# Patient Record
Sex: Female | Born: 1970
Health system: Southern US, Community
[De-identification: ages and names within clinical notes are randomized; demographics above are authoritative.]

## PROBLEM LIST (undated history)

## (undated) DIAGNOSIS — F329 Major depressive disorder, single episode, unspecified: Secondary | ICD-10-CM

## (undated) DIAGNOSIS — IMO0002 Reserved for concepts with insufficient information to code with codable children: Secondary | ICD-10-CM

## (undated) DIAGNOSIS — E039 Hypothyroidism, unspecified: Secondary | ICD-10-CM

## (undated) DIAGNOSIS — D509 Iron deficiency anemia, unspecified: Secondary | ICD-10-CM

## (undated) DIAGNOSIS — E119 Type 2 diabetes mellitus without complications: Secondary | ICD-10-CM

## (undated) DIAGNOSIS — I10 Essential (primary) hypertension: Secondary | ICD-10-CM

## (undated) DIAGNOSIS — F32A Depression, unspecified: Secondary | ICD-10-CM

## (undated) DIAGNOSIS — E079 Disorder of thyroid, unspecified: Secondary | ICD-10-CM

## (undated) DIAGNOSIS — M5414 Radiculopathy, thoracic region: Secondary | ICD-10-CM

## (undated) DIAGNOSIS — J45909 Unspecified asthma, uncomplicated: Secondary | ICD-10-CM

## (undated) DIAGNOSIS — F319 Bipolar disorder, unspecified: Secondary | ICD-10-CM

## (undated) HISTORY — DX: Unspecified asthma, uncomplicated: J45.909

## (undated) HISTORY — PX: TUBAL LIGATION: SHX77

## (undated) HISTORY — DX: Depression, unspecified: F32.A

## (undated) HISTORY — DX: Iron deficiency anemia, unspecified: D50.9

## (undated) HISTORY — DX: Hypothyroidism, unspecified: E03.9

## (undated) HISTORY — DX: Disorder of thyroid, unspecified: E07.9

## (undated) HISTORY — DX: Radiculopathy, thoracic region: M54.14

## (undated) HISTORY — DX: Reserved for concepts with insufficient information to code with codable children: IMO0002

## (undated) HISTORY — DX: Essential (primary) hypertension: I10

## (undated) HISTORY — DX: Type 2 diabetes mellitus without complications: E11.9

## (undated) HISTORY — DX: Major depressive disorder, single episode, unspecified: F32.9

## (undated) HISTORY — PX: EYE SURGERY: SHX253

## (undated) HISTORY — DX: Bipolar disorder, unspecified: F31.9

---

## 2016-05-29 LAB — PSA

## 2016-05-29 LAB — HM PAP SMEAR

## 2016-06-11 LAB — PSA

## 2016-12-12 ENCOUNTER — Ambulatory Visit (INDEPENDENT_AMBULATORY_CARE_PROVIDER_SITE_OTHER): Payer: Self-pay | Admitting: Family Medicine

## 2016-12-12 ENCOUNTER — Encounter: Payer: Self-pay | Admitting: Family Medicine

## 2016-12-12 VITALS — BP 128/80 | HR 103 | Temp 98.1°F | Ht 63.0 in | Wt 159.4 lb

## 2016-12-12 DIAGNOSIS — IMO0001 Reserved for inherently not codable concepts without codable children: Secondary | ICD-10-CM

## 2016-12-12 DIAGNOSIS — K219 Gastro-esophageal reflux disease without esophagitis: Secondary | ICD-10-CM | POA: Diagnosis not present

## 2016-12-12 DIAGNOSIS — J4540 Moderate persistent asthma, uncomplicated: Secondary | ICD-10-CM

## 2016-12-12 DIAGNOSIS — Z13220 Encounter for screening for lipoid disorders: Secondary | ICD-10-CM

## 2016-12-12 DIAGNOSIS — G8929 Other chronic pain: Secondary | ICD-10-CM

## 2016-12-12 DIAGNOSIS — M545 Low back pain: Secondary | ICD-10-CM | POA: Diagnosis not present

## 2016-12-12 DIAGNOSIS — F319 Bipolar disorder, unspecified: Secondary | ICD-10-CM

## 2016-12-12 DIAGNOSIS — E1165 Type 2 diabetes mellitus with hyperglycemia: Secondary | ICD-10-CM | POA: Diagnosis not present

## 2016-12-12 DIAGNOSIS — Z23 Encounter for immunization: Secondary | ICD-10-CM | POA: Diagnosis not present

## 2016-12-12 DIAGNOSIS — E039 Hypothyroidism, unspecified: Secondary | ICD-10-CM

## 2016-12-12 MED ORDER — ESOMEPRAZOLE MAGNESIUM 40 MG PO PACK: 40.0000 mg | PACK | Freq: Every day | ORAL | 5 refills | Status: DC

## 2016-12-12 MED ORDER — TRAMADOL-ACETAMINOPHEN 37.5-325 MG PO TABS: 2.0000 | ORAL_TABLET | Freq: Every day | ORAL | 5 refills | Status: DC

## 2016-12-12 MED ORDER — PREGABALIN 75 MG PO CAPS: 75.0000 mg | ORAL_CAPSULE | Freq: Every day | ORAL | 5 refills | Status: DC

## 2016-12-12 MED ORDER — NAPROXEN 500 MG PO TABS: 500.0000 mg | ORAL_TABLET | Freq: Every day | ORAL | 5 refills | Status: DC

## 2016-12-12 MED FILL — ESOMEPRAZOLE MAG DR 40 MG C: 40 | 30 days supply | Qty: 30 | Fill #0 | Status: TO

## 2016-12-12 MED FILL — TRAMADOL-ACETAMINOPHN 37.5-: 37.5-325 | 30 days supply | Qty: 60 | Fill #0 | Status: TO

## 2016-12-12 MED FILL — NAPROXEN 500 MG TABLET: 500 | 30 days supply | Qty: 30 | Fill #0 | Status: TO

## 2016-12-12 NOTE — Patient Instructions (Addendum)
Healthy Eating Plan Many factors influence your heart health, including eating and exercise habits. Heart (coronary) risk increases with abnormal blood fat (lipid) levels. Heart-healthy meal planning includes limiting unhealthy fats, increasing healthy fats, and making other small dietary changes. This includes maintaining a healthy body weight to help keep lipid levels within a normal range.  WHAT IS MY PLAN?  Your health care provider recommends that you:  Drink a glass of water before meals to help with satiety.  Eat slowly.  An alternative to the water is to add Metamucil. This will help with satiety as well. It does contain calories, unlike water.  WHAT TYPES OF FAT SHOULD I CHOOSE?  Choose healthy fats more often. Choose monounsaturated and polyunsaturated fats, such as olive oil and canola oil, flaxseeds, walnuts, almonds, and seeds.  Eat more omega-3 fats. Good choices include salmon, mackerel, sardines, tuna, flaxseed oil, and ground flaxseeds. Aim to eat fish at least two times each week.  Avoid foods with partially hydrogenated oils in them. These contain trans fats. Examples of foods that contain trans fats are stick margarine, some tub margarines, cookies, crackers, and other baked goods. If you are going to avoid a fat, this is the one to avoid!  WHAT GENERAL GUIDELINES DO I NEED TO FOLLOW?  Check food labels carefully to identify foods with trans fats. Avoid these types of options when possible.  Fill one half of your plate with vegetables and green salads. Eat 4-5 servings of vegetables per day. A serving of vegetables equals 1 cup of raw leafy vegetables,  cup of raw or cooked cut-up vegetables, or  cup of vegetable juice.  Fill one fourth of your plate with whole grains. Look for the word "whole" as the first word in the ingredient list.  Fill one fourth of your plate with lean protein foods.  Eat 4-5 servings of fruit per day. A serving of fruit equals one medium  whole fruit,  cup of dried fruit,  cup of fresh, frozen, or canned fruit. Try to avoid fruits in cups/syrups as the sugar content can be high.  Eat more foods that contain soluble fiber. Examples of foods that contain this type of fiber are apples, broccoli, carrots, beans, peas, and barley. Aim to get 20-30 g of fiber per day.  Eat more home-cooked food and less restaurant, buffet, and fast food.  Limit or avoid alcohol.  Limit foods that are high in starch and sugar.  Avoid fried foods when able.  Cook foods by using methods other than frying. Baking, boiling, grilling, and broiling are all great options. Other fat-reducing suggestions include: ? Removing the skin from poultry. ? Removing all visible fats from meats. ? Skimming the fat off of stews, soups, and gravies before serving them. ? Steaming vegetables in water or broth.  Lose weight if you are overweight. Losing just 5-10% of your initial body weight can help your overall health and prevent diseases such as diabetes and heart disease.  Increase your consumption of nuts, legumes, and seeds to 4-5 servings per week. One serving of dried beans or legumes equals  cup after being cooked, one serving of nuts equals 1 ounces, and one serving of seeds equals  ounce or 1 tablespoon.  WHAT ARE GOOD FOODS CAN I EAT? Grains Grainy breads (try to find bread that is 3 g of fiber per slice or greater), oatmeal, light popcorn. Whole-grain cereals. Rice and pasta, including brown rice and those that are made with whole wheat.   Edamame pasta is a great alternative to grain pasta. It has a higher protein content. Try to avoid significant consumption of white bread, sugary cereals, or pastries/baked goods.  Vegetables All vegetables. Cooked white potatoes do not count as vegetables.  Fruits All fruits, but limit pineapple and bananas as these fruits have a higher sugar content.  Meats and Other Protein Sources Lean, well-trimmed beef,  veal, pork, and lamb. Chicken and [place] without skin. All fish and shellfish. Wild duck, rabbit, pheasant, and venison. Egg whites or low-cholesterol egg substitutes. Dried beans, peas, lentils, and tofu.Seeds and most nuts.  Dairy Low-fat or nonfat cheeses, including ricotta, string, and mozzarella. Skim or 1% milk that is liquid, powdered, or evaporated. Buttermilk that is made with low-fat milk. Nonfat or low-fat yogurt. Soy/Almond milk are good alternatives if you cannot handle dairy.  Beverages Water is the best for you. Sports drinks with less sugar are more desirable unless you are a highly active athlete.  Sweets and Desserts Sherbets and fruit ices. Honey, jam, marmalade, jelly, and syrups. Dark chocolate.  Eat all sweets and desserts in moderation.  Fats and Oils Nonhydrogenated (trans-free) margarines. Vegetable oils, including soybean, sesame, sunflower, olive, peanut, safflower, corn, canola, and cottonseed. Salad dressings or mayonnaise that are made with a vegetable oil. Limit added fats and oils that you use for cooking, baking, salads, and as spreads.  Other Cocoa powder. Coffee and tea. Most condiments.  The items listed above may not be a complete list of recommended foods or beverages. Contact your dietitian for more options.  [place]

## 2016-12-12 NOTE — Progress Notes (Signed)
Pre visit review using our clinic review tool, if applicable. No additional management support is needed unless otherwise documented below in the visit note. 

## 2016-12-12 NOTE — Progress Notes (Signed)
Chief Complaint  Patient presents with  . Establish Care    pt want to discuss her DM       New Patient Visit SUBJECTIVE: HPI: Stephanie Travis is an 46 y.o.female who is being seen for establishing care.  The patient was previously seen at an office in [place].  DM Pt does not currently have a glucometer She does not require insulin. She is currently taking a sulfonylurea and Metformin. She is not on a statin is on an ACEi/ARB. PCV23? Never Flu shot? Will get today  Eye exam? 05/2016  LBP Hx of chronic low back pain, currently on meds, Never had PT for this, no imaging.  Does have some numbness and tingling in LLE, unsure if it is due to DM. No weakness, loss of bowel/bladder function.  She does have a history of bipolar disorder, ADHD, asthma, and hypothyroidism. She is stable on her current regimen for each of these, however was following psychiatry.  No Known Allergies  Past Medical History:  Diagnosis Date  . Asthma   . Bipolar 1 disorder ([place]) 12/13/2016  . Depression   . Diabetes mellitus without complication ([place])   . Hypertension   . Hypothyroid 12/13/2016  . Iron (Fe) deficiency anemia   . Pinched thoracic nerve root   . Thyroid disease   . Type 2 diabetes mellitus ([place]) 12/13/2016  . Ulcer [facility])    Past Surgical History:  Procedure Laterality Date  . CESAREAN SECTION     x 3  . EYE SURGERY     Laser x 2  . TUBAL LIGATION     Social History   Social History  . Marital status: Married   Social History Main Topics  . Smoking status: Former Smoker    Quit date: 12/12/2013  . Smokeless tobacco: Never Used  . Alcohol use Yes     Comment: Former alcoholic  . Drug use: No   Family History  Problem Relation Age of Onset  . Arthritis Mother   . Ulcers Mother   . Depression Father   . Diabetes Father   . Arthritis Father   . Hypertension Father   . Ulcers Sister   . ADD / ADHD Daughter   . Anxiety disorder Daughter   . Stroke Maternal  Grandfather   . Alzheimer's disease Paternal Grandmother   . Depression Paternal Grandfather   . Bipolar disorder Paternal Grandfather     Current Outpatient Prescriptions:  .  ARIPiprazole (ABILIFY) 5 MG tablet, Take 5 mg by mouth at bedtime., Disp: , Rfl:  .  metFORMIN (GLUCOPHAGE) 1000 MG tablet, Take 1,000 mg by mouth 2 (two) times daily with a meal., Disp: , Rfl:  .  methylphenidate (RITALIN LA) 40 MG 24 hr capsule, Take 40 mg by mouth every morning., Disp: , Rfl:  .  pregabalin (LYRICA) 75 MG capsule, Take 1 capsule (75 mg total) by mouth at bedtime., Disp: 30 capsule, Rfl: 5 .  thiamine 100 MG tablet, Take 100 mg by mouth daily., Disp: , Rfl:  .  traMADol-acetaminophen (ULTRACET) 37.5-325 MG tablet, Take 2 tablets by mouth at bedtime., Disp: 60 tablet, Rfl: 5 .  Fluticasone-Salmeterol (ADVAIR DISKUS) 250-50 MCG/DOSE AEPB, Inhale 1 puff into the lungs 2 (two) times daily., Disp: 1 each, Rfl: 3 .  glyBURIDE (DIABETA) 5 MG tablet, Take 1 tablet (5 mg total) by mouth daily with breakfast., Disp: , Rfl:  .  levothyroxine (SYNTHROID, LEVOTHROID) 100 MCG tablet, Take 1 tablet (100 mcg total)  by mouth daily., Disp: 90 tablet, Rfl: 3 .  lisinopril (PRINIVIL,ZESTRIL) 20 MG tablet, Take 1 tablet (20 mg total) by mouth daily., Disp: 90 tablet, Rfl: 1 .  lithium 300 MG tablet, Take 1 tablet (300 mg total) by mouth at bedtime., Disp: , Rfl:  .  naproxen (NAPROSYN) 500 MG tablet, Take 1 tablet (500 mg total) by mouth at bedtime., Disp: 30 tablet, Rfl: 5  Patient's last menstrual period was 11/21/2016.  ROS Cardiovascular: Denies chest pain  Respiratory: Denies dyspnea   OBJECTIVE: BP 128/80 (BP Location: Left Arm, Patient Position: Sitting, Cuff Size: Normal)   Pulse (!) 103   Temp 98.1 F (36.7 C) (Oral)   Ht 5\' 3"  (1.6 m)   Wt 159 lb 6.4 oz (72.3 kg)   LMP 11/21/2016   SpO2 98%   BMI 28.24 kg/m   Constitutional: -  VS reviewed -  Well developed, well nourished, appears stated age -   No apparent distress  Psychiatric: -  Oriented to person, place, and time -  Memory intact -  Affect and mood normal -  Fluent conversation, good eye contact -  Judgment and insight age appropriate  Eye: -  Conjunctivae clear, no discharge -  Pupils symmetric, round, reactive to light  ENMT: -  Oral mucosa without lesions, tongue and uvula midline    Tonsils not enlarged, no erythema, no exudate, trachea midline    Pharynx moist, no lesions, no erythema  Neck: -  No gross swelling, no palpable masses -  Thyroid midline, not enlarged, mobile, no palpable masses  Cardiovascular: -  RRR, no murmurs -  No LE edema  Respiratory: -  Normal respiratory effort, no accessory muscle use, no retraction -  Breath sounds equal, no wheezes, no ronchi, no crackles  Neurological:  -  CN II - XII grossly intact -  4/4 patellar reflex b/l, no clonus -  Sensation grossly intact to light touch, equal bilaterally  Musculoskeletal: -  No clubbing, no cyanosis -  Gait normal -  TTP in lumbar paraspinals -  Neg straight leg and Lesegue's -  5/5 strength throughout in LE's  Skin: -  No significant lesion on inspection -  Warm and dry to palpation   ASSESSMENT/PLAN: Chronic bilateral low back pain without sciatica - Plan: naproxen (NAPROSYN) 500 MG tablet, traMADol-acetaminophen (ULTRACET) 37.5-325 MG tablet, pregabalin (LYRICA) 75 MG capsule, MR Lumbar Spine Wo Contrast  Gastroesophageal reflux disease, esophagitis presence not specified - Plan: DISCONTINUED: esomeprazole (NEXIUM) 40 MG packet  Screening cholesterol level - Plan: Lipid panel  Uncontrolled type 2 diabetes mellitus without complication, without long-term current use of insulin (HCC) - Plan: Hemoglobin A1c, Comprehensive metabolic panel, Microalbumin / creatinine urine ratio, glyBURIDE (DIABETA) 5 MG tablet  Need for influenza vaccination - Plan: Flu Vaccine QUAD 36+ mos PF IM (Fluarix & Fluzone Quad PF)  Need for pneumococcal  vaccination - Plan: Pneumococcal polysaccharide vaccine 23-valent greater than or equal to 2yo subcutaneous/IM  Moderate persistent asthma without complication - Plan: Fluticasone-Salmeterol (ADVAIR DISKUS) 250-50 MCG/DOSE AEPB  Bipolar 1 disorder (HCC) - Plan: lithium 300 MG tablet  Hypothyroidism, unspecified type - Plan: levothyroxine (SYNTHROID, LEVOTHROID) 100 MCG tablet  Patient instructed to sign release of records form from her previous PCP. Orders as above. Need to give list of psych resources so she can get set up. I will refill meds until she is set.  I need to change her meds from generics in [place] to one's offered in [name].  Check sugars 3-4 times per week. Counseled on diet and exercise. Offered PT, but due to transportation issues, pt would like to start with imaging. Stated that insurance may want to have the PT first, but we will try. Patient should return in 2 weeks to review labs and have a dedicated DM visit. The patient voiced understanding and agreement to the plan.  Greater than 45 minutes were spent face to face with the patient with greater than 50% of this time spent counseling with DM and coordinating her medications. Her S African versions needed to be translated to [name] versions.   [place], DO 12/13/16  10:03 AM

## 2016-12-13 ENCOUNTER — Encounter: Payer: Self-pay | Admitting: Family Medicine

## 2016-12-13 DIAGNOSIS — J45909 Unspecified asthma, uncomplicated: Secondary | ICD-10-CM | POA: Insufficient documentation

## 2016-12-13 DIAGNOSIS — F341 Dysthymic disorder: Secondary | ICD-10-CM | POA: Insufficient documentation

## 2016-12-13 DIAGNOSIS — F319 Bipolar disorder, unspecified: Secondary | ICD-10-CM

## 2016-12-13 DIAGNOSIS — E119 Type 2 diabetes mellitus without complications: Secondary | ICD-10-CM

## 2016-12-13 DIAGNOSIS — E039 Hypothyroidism, unspecified: Secondary | ICD-10-CM

## 2016-12-13 HISTORY — DX: Hypothyroidism, unspecified: E03.9

## 2016-12-13 HISTORY — DX: Bipolar disorder, unspecified: F31.9

## 2016-12-13 HISTORY — DX: Type 2 diabetes mellitus without complications: E11.9

## 2016-12-15 ENCOUNTER — Ambulatory Visit (HOSPITAL_BASED_OUTPATIENT_CLINIC_OR_DEPARTMENT_OTHER): Payer: Self-pay

## 2016-12-18 ENCOUNTER — Other Ambulatory Visit: Payer: Self-pay

## 2016-12-18 ENCOUNTER — Ambulatory Visit (HOSPITAL_COMMUNITY)
Admission: RE | Admit: 2016-12-18 | Discharge: 2016-12-18 | Disposition: A | Discharge status: Home / Self Care | Payer: BLUE CROSS/BLUE SHIELD | Attending: Psychiatry | Admitting: Psychiatry

## 2016-12-18 ENCOUNTER — Telehealth: Payer: Self-pay | Admitting: Family Medicine

## 2016-12-18 LAB — MICROALBUMIN / CREATININE URINE RATIO
CREATININE, U: 187 mg/dL
MICROALB UR: 8.7 mg/dL — AB (ref 0.0–1.9)
Microalb Creat Ratio: 4.7 mg/g (ref 0.0–30.0)

## 2016-12-18 LAB — COMPREHENSIVE METABOLIC PANEL
ALK PHOS: 79 U/L (ref 39–117)
ALT: 42 U/L — ABNORMAL HIGH (ref 0–35)
AST: 23 U/L (ref 0–37)
Albumin: 4.3 g/dL (ref 3.5–5.2)
BUN: 14 mg/dL (ref 6–23)
CHLORIDE: 97 meq/L (ref 96–112)
CO2: 29 meq/L (ref 19–32)
Calcium: 9.5 mg/dL (ref 8.4–10.5)
Creatinine, Ser: 0.75 mg/dL (ref 0.40–1.20)
GFR: 89.38 mL/min (ref >60.00)
GLUCOSE: 206 mg/dL — AB (ref 70–99)
POTASSIUM: 4 meq/L (ref 3.5–5.1)
SODIUM: 135 meq/L (ref 135–145)
TOTAL PROTEIN: 7.2 g/dL (ref 6.0–8.3)
Total Bilirubin: 0.3 mg/dL (ref 0.2–1.2)

## 2016-12-18 LAB — LIPID PANEL
CHOL/HDL RATIO: 3
Cholesterol: 184 mg/dL (ref 0–200)
HDL: 52.7 mg/dL (ref >39.00)
NONHDL: 131.08
Triglycerides: 270 mg/dL — ABNORMAL HIGH (ref 0.0–149.0)
VLDL: 54 mg/dL — ABNORMAL HIGH (ref 0.0–40.0)

## 2016-12-18 LAB — LDL CHOLESTEROL, DIRECT: Direct LDL: 96 mg/dL

## 2016-12-18 LAB — HEMOGLOBIN A1C: Hgb A1c MFr Bld: 10.5 % — ABNORMAL HIGH (ref 4.6–6.5)

## 2016-12-18 NOTE — H&P (Signed)
Behavioral Health Medical Screening Exam  Stephanie Travis is an 46 y.o. female.  Total Time spent with patient: 15 minutes  Psychiatric Specialty Exam: Physical Exam  Constitutional: She is oriented to person, place, and time. She appears well-developed and well-nourished.  HENT:  Head: Normocephalic and atraumatic.  Eyes: Conjunctivae are normal. Pupils are equal, round, and reactive to light.  Neck: Normal range of motion.  Cardiovascular: Normal rate, regular rhythm and normal heart sounds.   Respiratory: Effort normal and breath sounds normal.  GI: Soft. Bowel sounds are normal.  Musculoskeletal: Normal range of motion.  Neurological: She is alert and oriented to person, place, and time.  Skin: Skin is warm and dry.    Review of Systems  Psychiatric/Behavioral: Positive for depression. Negative for hallucinations, substance abuse and suicidal ideas. The patient is nervous/anxious and has insomnia.   All other systems reviewed and are negative.   Blood pressure 123/67, pulse (!) 103, temperature 99 F (37.2 C), resp. rate 18, last menstrual period 11/21/2016, SpO2 99 %.There is no height or weight on file to calculate BMI.  General Appearance: Casual and Fairly Groomed  Eye Contact:  Good  Speech:  Clear and Coherent and Normal Rate  Volume:  Normal  Mood:  Anxious  Affect:  Appropriate and Congruent  Thought Process:  Coherent, Goal Directed, Linear and Descriptions of Associations: Intact  Orientation:  Full (Time, Place, and Person)  Thought Content:  Focused on medications  Suicidal Thoughts:  No  Homicidal Thoughts:  No  Memory:  Immediate;   Fair Recent;   Fair Remote;   Fair  Judgement:  Fair  Insight:  Fair  Psychomotor Activity:  Normal  Concentration: Concentration: Fair and Attention Span: Fair  Recall:  [employer] of Knowledge:Fair  Language: Fair  Akathisia:  No  Handed:    AIMS (if indicated):     Assets:  Communication Skills Desire for  Improvement Social Support Talents/Skills  Sleep:       Musculoskeletal: Strength & Muscle Tone: within normal limits Gait & Station: normal Patient leans: N/A  Blood pressure 123/67, pulse (!) 103, temperature 99 F (37.2 C), resp. rate 18, last menstrual period 11/21/2016, SpO2 99 %.  Recommendations:  Based on my evaluation the patient does not appear to have an emergency medical condition.  Stephanie Mola, FNP 12/18/2016, 6:25 PM

## 2016-12-18 NOTE — Telephone Encounter (Signed)
Caller name: Azia Relation to pt: self  Call back number: [phone] Pharmacy:  Reason for call: Pt came in office stating that she went to the pharmacy to pick up her meds, pt states during her visit on the 12-12-2016 with PCP, pt mentioned that she needed all her meds to be sent to pharmacy since she had a prescription from [place] but that prescription does not work here and that she is needing all her meds sent to the pharmacy with the exception of LYRICA, NAPROXEN and TRAMADOL, since these meds were already sent to pharmacy on the 12-12-2016. Please advise.

## 2016-12-18 NOTE — BH Assessment (Addendum)
Walk In Assessment Note  Stephanie Travis is an 46 y.o. female that requests outpatient resources for medication management.  Patient denies SI/HI/Psychosis/Substance Abuse.   Patient is a walk in that was accompanied by Stephanie Stephanie Travis.  Patient reports that she is from [place] and she moved to [place] with Stephanie Travis and three children ages (47,5,62) years old.  Patient reports that Stephanie Travis's job relocated the family to [facility].   Patient reports that she was receiving services for Stephanie psychiatric medication management and she needs to have Stephanie psychiatric medicatio    Patient reports a prior psychiatric inpatient hospitalization in 2015 due to a suicide attempt.  Patient reports a past history of alcohol abuse.  Patient reports that she has been clean for the past five years.      Diagnosis: Bipolar Disorder   Past Medical History:  Past Medical History:  Diagnosis Date  . Asthma   . Bipolar 1 disorder ([place]) 12/13/2016  . Depression   . Diabetes mellitus without complication ([place])   . Hypertension   . Hypothyroid 12/13/2016  . Iron (Fe) deficiency anemia   . Pinched thoracic nerve root   . Thyroid disease   . Type 2 diabetes mellitus ([place]) 12/13/2016  . Ulcer [facility])     Past Surgical History:  Procedure Laterality Date  . CESAREAN SECTION     x 3  . EYE SURGERY     Laser x 2  . TUBAL LIGATION      Family History:  Family History  Problem Relation Age of Onset  . Arthritis Stephanie   . Ulcers Stephanie   . Depression Father   . Diabetes Father   . Arthritis Father   . Hypertension Father   . Ulcers Sister   . ADD / ADHD Daughter   . Anxiety disorder Daughter   . Stroke Maternal Grandfather   . Alzheimer's disease Paternal Grandmother   . Depression Paternal Grandfather   . Bipolar disorder Paternal Grandfather     Social History:  reports that she quit smoking about 3 years ago. She has never used smokeless tobacco. She reports that she drinks alcohol. She reports that  she does not use drugs.  Additional Social History:  Alcohol / Drug Use History of alcohol / drug use?: Yes Longest period of sobriety (when/how long): 5 years Negative Consequences of Use:  (None Reported) Withdrawal Symptoms:  (None Reported) Substance #1 Name of Substance 1: Alcohol 1 - Age of First Use: 20 1 - Amount (size/oz): [name] has been clean for five years. 1 - Frequency: [name] has been clean for five years. 1 - Duration: [name] has been clean for five years. 1 - Last Use / Amount: [name] has been clean for five years.  CIWA:   COWS:    Allergies: No Known Allergies  Home Medications:  (Not in a hospital admission)  OB/GYN Status:  Patient's last menstrual period was 11/21/2016.  General Assessment Data Location of Assessment: [facility] Assessment Services TTS Assessment: In system Is this a Tele or Face-to-Face Assessment?: Face-to-Face Is this an Initial Assessment or a Re-assessment for this encounter?: Initial Assessment Marital status: Married (Married since 2004) [place] name: NA Is patient pregnant?: No Pregnancy Status: No Living Arrangements: Spouse/significant other, Children (Patient lives with Stephanie Travis and three children (11,9,7)yo) Can pt return to current living arrangement?: Yes Admission Status: Voluntary Is patient capable of signing voluntary admission?: Yes Referral Source: Self/Family/Friend Insurance type: Self-Pay  Medical Screening Exam [facility]  Walk-in ONLY) Medical Exam completed: Yes Heloise Purpura, NP - completed MSE Exam..)  Crisis Care Plan Living Arrangements: Spouse/significant other, Children (Patient lives with Stephanie Travis and three children (11,9,7)yo) Legal Guardian:  (NA) Name of Psychiatrist: None Reported (Previous psychiatrist in [place]) Name of Therapist: None Reported  Education Status Is patient currently in school?: No Current Grade: NA Highest grade of school patient has completed: NA Name of school:  NA Contact person: None Reported  Risk to self with the past 6 months Suicidal Ideation: No Has patient been a risk to self within the past 6 months prior to admission? : No Suicidal Intent: No Has patient had any suicidal intent within the past 6 months prior to admission? : No Is patient at risk for suicide?: No Suicidal Plan?: No Has patient had any suicidal plan within the past 6 months prior to admission? : No Access to Means: No What has been your use of drugs/alcohol within the last 12 months?: na Previous Attempts/Gestures: Yes How many times?: 1 Other Self Harm Risks: None Reported Triggers for Past Attempts: Other (Comment) (Abusing alcohol) Intentional Self Injurious Behavior: None Family Suicide History: Unknown Recent stressful life event(s): Other (Comment) (Moved from [place]  to [place] 1 week ago) Persecutory voices/beliefs?: No Depression: Yes Depression Symptoms: Despondent, Fatigue, Guilt Substance abuse history and/or treatment for substance abuse?: Yes (Clean for 5 years) Suicide prevention information given to non-admitted patients: Yes  Risk to Others within the past 6 months Homicidal Ideation: No Does patient have any lifetime risk of violence toward others beyond the six months prior to admission? : No Thoughts of Harm to Others: No Current Homicidal Intent: No Current Homicidal Plan: No Access to Homicidal Means: No Identified Victim: None Reported History of harm to others?: No Assessment of Violence: None Noted Violent Behavior Description: None Reported Does patient have access to weapons?: No Criminal Charges Pending?: No Does patient have a court date: No Is patient on probation?: No  Psychosis Hallucinations: None noted Delusions: None noted  Mental Status Report Appearance/Hygiene: Disheveled Eye Contact: Fair Motor Activity: Freedom of movement Speech: Logical/coherent Level of Consciousness: Alert Mood: Depressed Affect:  Appropriate to circumstance Anxiety Level: Minimal Thought Processes: Coherent, Relevant Judgement: Unimpaired Orientation: Person, Place, Time, Situation Obsessive Compulsive Thoughts/Behaviors: None  Cognitive Functioning Concentration: Decreased Memory: Recent Intact, Remote Intact IQ: Average Insight: Fair Impulse Control: Good Appetite: Fair Weight Loss: 0 Weight Gain: 0 Sleep: No Change Total Hours of Sleep: 8 Vegetative Symptoms: None  ADLScreening [facility] Assessment Services) Patient's cognitive ability adequate to safely complete daily activities?: Yes Patient able to express need for assistance with ADLs?: No Independently performs ADLs?: Yes (appropriate for developmental age)  Prior Inpatient Therapy Prior Inpatient Therapy: Yes Prior Therapy Dates: 2015 Prior Therapy Facilty/Provider(s): [facility]  Reason for Treatment: SI and SA Treatment   Prior Outpatient Therapy Prior Outpatient Therapy: Yes Prior Therapy Dates: 2018 Prior Therapy Facilty/Provider(s): [facility] Reason for Treatment: Medication Management  Does patient have an ACCT team?: No Does patient have Intensive In-House Services?  : No Does patient have [facility] services? : No Does patient have P4CC services?: No  ADL Screening (condition at time of admission) Patient's cognitive ability adequate to safely complete daily activities?: Yes Is the patient deaf or have difficulty hearing?: No Does the patient have difficulty seeing, even when wearing glasses/contacts?: No Does the patient have difficulty concentrating, remembering, or making decisions?: No Patient able to express need for assistance with ADLs?: No  Does the patient have difficulty dressing or bathing?: No Independently performs ADLs?: Yes (appropriate for developmental age) Does the patient have difficulty walking or climbing stairs?: No Weakness of Legs: None Weakness of Arms/Hands: None  Home  Assistive Devices/Equipment Home Assistive Devices/Equipment: None    Abuse/Neglect Assessment (Assessment to be complete while patient is alone) Physical Abuse: Denies Verbal Abuse: Denies Sexual Abuse: Denies Exploitation of patient/patient's resources: Denies Self-Neglect: Denies Values / Beliefs Cultural Requests During Hospitalization: None Spiritual Requests During Hospitalization: None Consults Spiritual Care Consult Needed: No Social Work Consult Needed: No Regulatory affairs officer (For Healthcare) Does Patient Have a Medical Advance Directive?: No Would patient like information on creating a medical advance directive?: No - Patient declined    Additional Information 1:1 In Past 12 Months?: No CIRT Risk: No Elopement Risk: No Does patient have medical clearance?: Yes     Disposition: Per Catalina Pizza, DNP.  Patient does not meet criteria for inpatient hospitalization.  Writer provided patient with outpatient resources for [facility] as well as [facility] open access.    Disposition Initial Assessment Completed for this Encounter: Yes Disposition of Patient: Outpatient treatment Type of outpatient treatment: Adult  On Site Evaluation by:   Reviewed with Physician:    Graciella Freer [place] 12/18/2016 12:27 PM

## 2016-12-19 NOTE — Telephone Encounter (Signed)
Attempted to contact patient unable to leave Vm. Pt Rx was sent to the pharmacy on 2/14 and 2/15. TL/CMA

## 2016-12-20 ENCOUNTER — Other Ambulatory Visit: Payer: Self-pay | Admitting: Family Medicine

## 2016-12-20 DIAGNOSIS — F319 Bipolar disorder, unspecified: Secondary | ICD-10-CM

## 2016-12-20 DIAGNOSIS — E039 Hypothyroidism, unspecified: Secondary | ICD-10-CM

## 2016-12-20 DIAGNOSIS — J4540 Moderate persistent asthma, uncomplicated: Secondary | ICD-10-CM

## 2016-12-20 MED ORDER — LITHIUM CARBONATE 300 MG PO TABS: 300.0000 mg | ORAL_TABLET | Freq: Every day | ORAL | 1 refills | Status: DC

## 2016-12-20 MED ORDER — LEVOTHYROXINE SODIUM 100 MCG PO TABS: 100.0000 ug | ORAL_TABLET | Freq: Every day | ORAL | 1 refills | Status: DC

## 2016-12-20 MED ORDER — METFORMIN HCL 1000 MG PO TABS: 1000.0000 mg | ORAL_TABLET | Freq: Two times a day (BID) | ORAL | 1 refills | Status: DC

## 2016-12-20 MED ORDER — FLUTICASONE-SALMETEROL 250-50 MCG/DOSE IN AEPB: 1.0000 | INHALATION_SPRAY | Freq: Two times a day (BID) | RESPIRATORY_TRACT | 3 refills | Status: DC

## 2016-12-20 MED FILL — metFORMIN HCL 1000 MG TABS: 1000 | 45 days supply | Qty: 90 | Fill #0 | Status: TO

## 2016-12-20 MED FILL — LEVOTHYROXINE 100 MCG TAB: 100 | 90 days supply | Qty: 90 | Fill #0 | Status: TO

## 2016-12-20 MED FILL — LITHIUM CARBONATE 300 MG TA: 300 | 90 days supply | Qty: 90 | Fill #0 | Status: TO

## 2016-12-20 NOTE — Telephone Encounter (Signed)
I have spoken to patient and alerted her Rx was sent to pharmacy. TL/CMA

## 2016-12-25 MED FILL — LYRICA 75 MG CAPSULE: 75 | 30 days supply | Qty: 30 | Fill #0 | Status: TO

## 2016-12-25 MED FILL — ADVAIR 250/50 DISKUS: 250-50 | 30 days supply | Qty: 60 | Fill #0 | Status: TO

## 2017-01-03 ENCOUNTER — Encounter: Payer: Self-pay | Admitting: Family Medicine

## 2017-01-03 ENCOUNTER — Ambulatory Visit (INDEPENDENT_AMBULATORY_CARE_PROVIDER_SITE_OTHER): Payer: BLUE CROSS/BLUE SHIELD | Admitting: Family Medicine

## 2017-01-03 VITALS — BP 116/60 | HR 89 | Temp 97.6°F | Ht 63.0 in | Wt 162.4 lb

## 2017-01-03 DIAGNOSIS — F909 Attention-deficit hyperactivity disorder, unspecified type: Secondary | ICD-10-CM | POA: Diagnosis not present

## 2017-01-03 DIAGNOSIS — Z23 Encounter for immunization: Secondary | ICD-10-CM | POA: Diagnosis not present

## 2017-01-03 DIAGNOSIS — F319 Bipolar disorder, unspecified: Secondary | ICD-10-CM | POA: Diagnosis not present

## 2017-01-03 DIAGNOSIS — E119 Type 2 diabetes mellitus without complications: Secondary | ICD-10-CM

## 2017-01-03 MED ORDER — ATORVASTATIN CALCIUM 40 MG PO TABS: 40.0000 mg | ORAL_TABLET | Freq: Every day | ORAL | 3 refills | Status: DC

## 2017-01-03 MED ORDER — METHYLPHENIDATE HCL ER (LA) 40 MG PO CP24: 40.0000 mg | ORAL_CAPSULE | ORAL | 0 refills | Status: DC

## 2017-01-03 MED ORDER — GLYBURIDE 5 MG PO TABS: 10.0000 mg | ORAL_TABLET | Freq: Every day | ORAL | 5 refills | Status: DC

## 2017-01-03 MED ORDER — GLUCOSE BLOOD VI STRP: ORAL_STRIP | 12 refills | Status: DC

## 2017-01-03 MED ORDER — DESVENLAFAXINE ER 100 MG PO TB24: 100.0000 mg | ORAL_TABLET | Freq: Every day | ORAL | 5 refills | Status: DC

## 2017-01-03 MED ORDER — LISINOPRIL 20 MG PO TABS: 20.0000 mg | ORAL_TABLET | Freq: Every day | ORAL | 1 refills | Status: DC

## 2017-01-03 NOTE — Progress Notes (Signed)
Subjective:   Chief Complaint  Patient presents with  . Follow-up    to discuss recent lab results    Stephanie Travis is a 46 y.o. female here for follow-up of diabetes.   Stephanie Travis's self monitored glucose range is high 100's to low 200's. Patient denies hypoglycemic reactions. She checks her glucose levels 1 time per day. Patient does not require insulin.   Medications include: Metformin 1000 mg BID, Glyburide 5 mg daily Patient exercises 0 days per week on average.   Statin? No ACEi/ARB? Yes  Past Medical History:  Diagnosis Date  . Asthma   . Bipolar 1 disorder ([place]) 12/13/2016  . Depression   . Diabetes mellitus without complication ([place])   . Hypertension   . Hypothyroid 12/13/2016  . Iron (Fe) deficiency anemia   . Pinched thoracic nerve root   . Thyroid disease   . Type 2 diabetes mellitus ([place]) 12/13/2016  . Ulcer [facility])     Past Surgical History:  Procedure Laterality Date  . CESAREAN SECTION     x 3  . EYE SURGERY     Laser x 2  . TUBAL LIGATION      Social History   Social History  . Marital status: Married   Social History Main Topics  . Smoking status: Former Smoker    Quit date: 12/12/2013  . Smokeless tobacco: Never Used  . Alcohol use Yes     Comment: Former alcoholic  . Drug use: No   Current Outpatient Prescriptions on File Prior to Visit  Medication Sig Dispense Refill  . ARIPiprazole (ABILIFY) 5 MG tablet Take 5 mg by mouth at bedtime.    . Fluticasone-Salmeterol (ADVAIR DISKUS) 250-50 MCG/DOSE AEPB Inhale 1 puff into the lungs 2 (two) times daily. 1 each 3  . glyBURIDE (DIABETA) 5 MG tablet Take 1 tablet (5 mg total) by mouth daily with breakfast.    . levothyroxine (SYNTHROID, LEVOTHROID) 100 MCG tablet Take 1 tablet (100 mcg total) by mouth daily. 90 tablet 1  . lisinopril (PRINIVIL,ZESTRIL) 20 MG tablet Take 1 tablet (20 mg total) by mouth daily. 90 tablet 1  . lithium 300 MG tablet Take 1 tablet (300 mg total) by mouth at bedtime. 90 tablet 1   . metFORMIN (GLUCOPHAGE) 1000 MG tablet Take 1 tablet (1,000 mg total) by mouth 2 (two) times daily with a meal. 90 tablet 1  . methylphenidate (RITALIN LA) 40 MG 24 hr capsule Take 40 mg by mouth every morning.    . naproxen (NAPROSYN) 500 MG tablet Take 1 tablet (500 mg total) by mouth at bedtime. 30 tablet 5  . pregabalin (LYRICA) 75 MG capsule Take 1 capsule (75 mg total) by mouth at bedtime. 30 capsule 5  . thiamine 100 MG tablet Take 100 mg by mouth daily.    . traMADol-acetaminophen (ULTRACET) 37.5-325 MG tablet Take 2 tablets by mouth at bedtime. 60 tablet 5   Related testing: Foot exam(monofilament and inspection):done Retinal exam:done Date of retinal exam: 05/2016  Done by:  Dr in [place] Pneumovax: 12/12/16 Flu Shot: 12/12/16  Review of Systems: Eye:  No recent significant change in vision Pulmonary:  No SOB Cardiovascular:  No chest pain, no palpitations Skin/Integumentary ROS:  No abnormal skin lesions reported Neurologic:  No numbness, tingling  Objective:  BP 116/60 (BP Location: Left Arm, Patient Position: Sitting, Cuff Size: Normal)   Pulse 89   Temp 97.6 F (36.4 C) (Oral)   Ht 5\' 3"  (1.6 m)  Wt 162 lb 6.4 oz (73.7 kg)   LMP 12/26/2016   SpO2 98%   BMI 28.77 kg/m  General:  Well developed, well nourished, in no apparent distress Skin:  Warm, no pallor or diaphoresis Head:  Normocephalic, atraumatic Eyes:  Pupils equal and round, sclera anicteric without injection  Nose:  External nares without trauma, no discharge Throat/Pharynx:  Lips and gingiva without lesion Neck: Neck supple.  No obvious thyromegaly or masses.  No bruits Lungs:  clear to auscultation, breath sounds equal bilaterally, no wheezes, rales, or stridor Cardio:  regular rate and rhythm without murmurs, no bruits, no LE edema Abdomen:  Abdomen soft, non-tender, BS normal Musculoskeletal:  Symmetrical muscle groups noted without atrophy or deformity Neuro:  Sensation intact to pinprick on  feet Psych: Age appropriate judgment and insight  Assessment:   Type 2 diabetes mellitus without complication, without long-term current use of insulin (HCC) - Plan: lisinopril (PRINIVIL,ZESTRIL) 20 MG tablet, atorvastatin (LIPITOR) 40 MG tablet, glyBURIDE (DIABETA) 5 MG tablet, HM Diabetes Foot Exam  Attention deficit hyperactivity disorder (ADHD), unspecified ADHD type - Plan: methylphenidate (RITALIN LA) 40 MG 24 hr capsule  Uncontrolled type 2 diabetes mellitus without complication, without long-term current use of insulin ([place]) - Plan: glyBURIDE (DIABETA) 5 MG tablet  Bipolar 1 disorder (HCC) - Plan: Desvenlafaxine ER 100 MG TB24   Plan:   Orders as above. Will increase Glyburide steadily to 2 tabs twice daily. Discussed risk of hypoglycemia and instructions to 1 tab BID if that happens and to let us know. Stay on Metformin. Counseled on diet and exercise.  Start Lipitor given dx of DM. She has had a tubal ligation. Pt has found a psychiatrist and has an upcoming appt. Will bridge medicine until she establishes with him.  F/u in 3 mo or prn. The patient voiced understanding and agreement to the plan.  [place], DO 01/03/17 9:50 AM

## 2017-01-03 NOTE — Progress Notes (Signed)
Pre visit review using our clinic review tool, if applicable. No additional management support is needed unless otherwise documented below in the visit note. 

## 2017-01-03 NOTE — Patient Instructions (Addendum)
Glyburide- take 1 tab in AM and 1 tab in PM over next 2 weeks. After that take 2 tabs in AM and 1 tab in evening. After another 2 weeks, take 2 tabs in AM and 2 tabs PM. If you start having low sugars, then go back to 1 tab twice daily.  Take Lipitor at night.

## 2017-01-07 ENCOUNTER — Telehealth: Payer: Self-pay | Admitting: Family Medicine

## 2017-01-07 DIAGNOSIS — E119 Type 2 diabetes mellitus without complications: Secondary | ICD-10-CM

## 2017-01-07 MED ORDER — LISINOPRIL 20 MG PO TABS: 20.0000 mg | ORAL_TABLET | Freq: Every day | ORAL | 1 refills | Status: DC

## 2017-01-07 MED ORDER — ARIPIPRAZOLE 5 MG PO TABS: 5.0000 mg | ORAL_TABLET | Freq: Every day | ORAL | 1 refills | Status: DC

## 2017-01-07 NOTE — Telephone Encounter (Signed)
Request for Abilify 5mg -Take 1 tablet by mouth at bedtime. Last refill:Prescription has never been filled here. Last OV:01/03/17 Please advise.//AB/CMA

## 2017-01-07 NOTE — Telephone Encounter (Signed)
Self.   Refill request for Abilify and Lisinopril    Pharmacy: [facility]

## 2017-01-07 NOTE — Telephone Encounter (Signed)
OK to refill both for now. She is in process of establishing with psychiatry and knows that our office prescribing is temporary. TY.

## 2017-01-07 NOTE — Telephone Encounter (Signed)
Rx have been sent to the pharmacy by Dr. Wendling.//AB/CMA

## 2017-01-10 ENCOUNTER — Telehealth: Payer: Self-pay | Admitting: Family Medicine

## 2017-01-10 NOTE — Telephone Encounter (Signed)
Caller name: Relationship to patient:Self Can be reached: [phone]  Pharmacy:  Reason for call: Request a call back to discuss going on these medications for her diabetes.  Stephanie Travis and Dynagliclazide

## 2017-01-10 NOTE — Telephone Encounter (Signed)
Stephanie Travis is something we can discuss at her follow up, should be around early April, she is on a similar medicine in [place] (glyburide), and the specific brand, Dyna gliclazide, is not available in the [name]. TY.

## 2017-01-13 ENCOUNTER — Encounter: Payer: Self-pay | Admitting: Family Medicine

## 2017-01-14 NOTE — Telephone Encounter (Signed)
See message from telephone encounter for (01/10/17).//AB/CMA

## 2017-01-14 NOTE — Telephone Encounter (Signed)
Called and spoke with the pt and informed her the message below.    Pt verbalized understanding and agreed.  Pt asked to be scheduled for her 3 month follow-up on DM.  Pt was scheduled for (Mon-04/08/17 @ 10:00am).//AB/CMA

## 2017-01-15 ENCOUNTER — Ambulatory Visit (INDEPENDENT_AMBULATORY_CARE_PROVIDER_SITE_OTHER): Payer: BLUE CROSS/BLUE SHIELD | Admitting: Psychiatry

## 2017-01-15 ENCOUNTER — Encounter (HOSPITAL_COMMUNITY): Payer: Self-pay | Admitting: Psychiatry

## 2017-01-15 VITALS — BP 122/78 | HR 107 | Ht 63.0 in | Wt 158.6 lb

## 2017-01-15 DIAGNOSIS — F909 Attention-deficit hyperactivity disorder, unspecified type: Secondary | ICD-10-CM

## 2017-01-15 DIAGNOSIS — Z87891 Personal history of nicotine dependence: Secondary | ICD-10-CM | POA: Diagnosis not present

## 2017-01-15 DIAGNOSIS — F1021 Alcohol dependence, in remission: Secondary | ICD-10-CM | POA: Diagnosis not present

## 2017-01-15 DIAGNOSIS — F319 Bipolar disorder, unspecified: Secondary | ICD-10-CM

## 2017-01-15 DIAGNOSIS — Z79899 Other long term (current) drug therapy: Secondary | ICD-10-CM | POA: Diagnosis not present

## 2017-01-15 MED ORDER — ARIPIPRAZOLE 5 MG PO TABS: 7.5000 mg | ORAL_TABLET | Freq: Every day | ORAL | 1 refills | Status: DC

## 2017-01-15 MED ORDER — DESVENLAFAXINE ER 100 MG PO TB24: 100.0000 mg | ORAL_TABLET | Freq: Every day | ORAL | 5 refills | Status: DC

## 2017-01-15 MED ORDER — GABAPENTIN 300 MG PO CAPS: 300.0000 mg | ORAL_CAPSULE | Freq: Three times a day (TID) | ORAL | 3 refills | Status: DC

## 2017-01-15 MED ORDER — METHYLPHENIDATE HCL ER (LA) 40 MG PO CP24: 40.0000 mg | ORAL_CAPSULE | ORAL | 0 refills | Status: DC

## 2017-01-15 MED ORDER — PRAZOSIN HCL 2 MG PO CAPS: 2.0000 mg | ORAL_CAPSULE | Freq: Every day | ORAL | 1 refills | Status: DC

## 2017-01-15 NOTE — Progress Notes (Signed)
error 
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
## 2017-01-15 NOTE — Patient Instructions (Addendum)
STOP Lithium  STOP Lyrica (Pregabalin)  CONTINUE Desvenlafaxine   CONTINUE Ritalin as prescribed in the morning  INCREASE Abilify to 7.5 mg (1 and 1/2 tablet) nightly  START Prazosin 2 mg at bedtime for nightmares, increase to 4 mg in 1 week as needed  START Gabapentin 300 mg up to 3 times a day (twice a day is fine)

## 2017-02-04 ENCOUNTER — Encounter: Payer: Self-pay | Admitting: Family Medicine

## 2017-02-09 ENCOUNTER — Ambulatory Visit (HOSPITAL_BASED_OUTPATIENT_CLINIC_OR_DEPARTMENT_OTHER): Payer: BLUE CROSS/BLUE SHIELD

## 2017-02-11 ENCOUNTER — Telehealth: Payer: Self-pay | Admitting: Family Medicine

## 2017-02-11 NOTE — Telephone Encounter (Signed)
-----   Message from Katha Hamming sent at 02/08/2017 11:16 AM EDT ----- Regarding: mr lumbar Stephanie Travis cancelled her MRI for tomorrow, 02/09/17.  She cancelled due to financial reasons.  Thanks, Hoyle Sauer

## 2017-02-13 ENCOUNTER — Other Ambulatory Visit: Payer: Self-pay | Admitting: *Deleted

## 2017-02-13 DIAGNOSIS — J4540 Moderate persistent asthma, uncomplicated: Secondary | ICD-10-CM

## 2017-02-13 MED ORDER — FLUTICASONE-SALMETEROL 250-50 MCG/DOSE IN AEPB: 1.0000 | INHALATION_SPRAY | Freq: Two times a day (BID) | RESPIRATORY_TRACT | 1 refills | Status: DC

## 2017-02-13 NOTE — Telephone Encounter (Signed)
Rx sent to the pharmacy by e-script.//AB/CMA 

## 2017-02-19 ENCOUNTER — Ambulatory Visit (HOSPITAL_COMMUNITY): Payer: Self-pay | Admitting: Psychiatry

## 2017-02-21 ENCOUNTER — Other Ambulatory Visit: Payer: Self-pay | Admitting: *Deleted

## 2017-02-21 NOTE — Telephone Encounter (Signed)
Called and spoke with the pharmacists at [facility] and informed her that this is the pt's primary care office and he does not prescribe this medication for the pt.  She verbalized understanding, and stated that she will send it to the right provider.//AB/CMA

## 2017-03-07 ENCOUNTER — Telehealth: Payer: Self-pay | Admitting: *Deleted

## 2017-03-07 MED ORDER — METFORMIN HCL 1000 MG PO TABS: 1000.0000 mg | ORAL_TABLET | Freq: Two times a day (BID) | ORAL | 1 refills | Status: DC

## 2017-03-07 NOTE — Telephone Encounter (Signed)
Rx sent to the pharmacy by e-script.//AB/CMA 

## 2017-03-11 ENCOUNTER — Other Ambulatory Visit: Payer: Self-pay | Admitting: *Deleted

## 2017-03-11 MED ORDER — ESOMEPRAZOLE MAGNESIUM 40 MG PO CPDR: 40.0000 mg | DELAYED_RELEASE_CAPSULE | Freq: Every day | ORAL | 1 refills | Status: DC

## 2017-03-11 NOTE — Telephone Encounter (Signed)
Rx sent to the pharmacy by e-script.//AB/CMA 

## 2017-03-12 ENCOUNTER — Encounter: Payer: Self-pay | Admitting: Family Medicine

## 2017-03-13 ENCOUNTER — Ambulatory Visit (INDEPENDENT_AMBULATORY_CARE_PROVIDER_SITE_OTHER): Payer: BLUE CROSS/BLUE SHIELD | Admitting: Psychiatry

## 2017-03-13 ENCOUNTER — Encounter (HOSPITAL_COMMUNITY): Payer: Self-pay | Admitting: Psychiatry

## 2017-03-13 DIAGNOSIS — F319 Bipolar disorder, unspecified: Secondary | ICD-10-CM | POA: Diagnosis not present

## 2017-03-13 DIAGNOSIS — F909 Attention-deficit hyperactivity disorder, unspecified type: Secondary | ICD-10-CM | POA: Diagnosis not present

## 2017-03-13 DIAGNOSIS — Z810 Family history of intellectual disabilities: Secondary | ICD-10-CM | POA: Diagnosis not present

## 2017-03-13 DIAGNOSIS — Z79899 Other long term (current) drug therapy: Secondary | ICD-10-CM | POA: Diagnosis not present

## 2017-03-13 DIAGNOSIS — F1011 Alcohol abuse, in remission: Secondary | ICD-10-CM | POA: Diagnosis not present

## 2017-03-13 DIAGNOSIS — Z818 Family history of other mental and behavioral disorders: Secondary | ICD-10-CM | POA: Diagnosis not present

## 2017-03-13 DIAGNOSIS — Z87891 Personal history of nicotine dependence: Secondary | ICD-10-CM

## 2017-03-13 MED ORDER — METHYLPHENIDATE HCL ER (LA) 40 MG PO CP24: 40.0000 mg | ORAL_CAPSULE | ORAL | 0 refills | Status: DC

## 2017-03-13 MED ORDER — ARIPIPRAZOLE 10 MG PO TABS
10.0000 mg | ORAL_TABLET | Freq: Every day | ORAL | 0 refills | Status: DC
Start: 2017-03-13 — End: 2017-05-09

## 2017-03-13 MED ORDER — GABAPENTIN 300 MG PO CAPS: 600.0000 mg | ORAL_CAPSULE | Freq: Three times a day (TID) | ORAL | 3 refills | Status: DC

## 2017-03-13 NOTE — Progress Notes (Signed)
[facility] MD/PA/NP OP Progress Note  03/13/2017 10:19 AM Stephanie Travis  MRN:  [id number]  Chief Complaint: depressed Subjective:  Stephanie Travis presents today for medication management follow-up for bipolar depression. She has tolerated Abilify increased to 7.5 mg daily, but continues to struggle with tearfulness, down and depressed mood, and hopelessness at times. No suicidality or unsafe thoughts. She reports that prazosin has been effective for her sleep, and continues to take this nightly. She reports she continues to struggle with neuropathic pains related to sciatica and diabetes, and wonders about increasing gabapentin. We spent time discussing the prospects of initiating therapy, and she would be more open to doing this after she obtains her driver's license. No other acute issues at this time and will follow up in 8 weeks.  Visit Diagnosis:    ICD-9-CM ICD-10-CM   1. Attention deficit hyperactivity disorder (ADHD), unspecified ADHD type 314.01 F90.9 methylphenidate (RITALIN LA) 40 MG 24 hr capsule  2. Bipolar 1 disorder (HCC) 296.7 F31.9 ARIPiprazole (ABILIFY) 10 MG tablet     gabapentin (NEURONTIN) 300 MG capsule    Past Psychiatric History: See intake H&P for full details. Reviewed, with no updates at this time.  Past Medical History:  Past Medical History:  Diagnosis Date  . Asthma   . Bipolar 1 disorder ([place]) 12/13/2016  . Depression   . Diabetes mellitus without complication ([place])   . Hypertension   . Hypothyroid 12/13/2016  . Iron (Fe) deficiency anemia   . Pinched thoracic nerve root   . Thyroid disease   . Type 2 diabetes mellitus ([place]) 12/13/2016  . Ulcer     Past Surgical History:  Procedure Laterality Date  . CESAREAN SECTION     x 3  . EYE SURGERY     Laser x 2  . TUBAL LIGATION      Family Psychiatric History: See intake H&P for full details. Reviewed, with no updates at this time.   Family History:  Family History  Problem Relation Age of Onset  . Arthritis  Mother   . Ulcers Mother   . Depression Father   . Diabetes Father   . Arthritis Father   . Hypertension Father   . Ulcers Sister   . ADD / ADHD Daughter   . Anxiety disorder Daughter   . Stroke Maternal Grandfather   . Alzheimer's disease Paternal Grandmother   . Depression Paternal Grandfather   . Bipolar disorder Paternal Grandfather     Social History:  Social History   Social History  . Marital status: Married    Spouse name: N/A  . Number of children: N/A  . Years of education: N/A   Social History Main Topics  . Smoking status: Former Smoker    Quit date: 12/12/2013  . Smokeless tobacco: Never Used  . Alcohol use Yes     Comment: Former alcoholic  . Drug use: No  . Sexual activity: Yes    Birth control/ protection: None   Other Topics Concern  . None   Social History Narrative  . None    Allergies: No Known Allergies  Metabolic Disorder Labs: Lab Results  Component Value Date   HGBA1C 10.5 (H) 12/18/2016   No results found for: PROLACTIN Lab Results  Component Value Date   CHOL 184 12/18/2016   TRIG 270.0 (H) 12/18/2016   HDL 52.70 12/18/2016   CHOLHDL 3 12/18/2016   VLDL 54.0 (H) 12/18/2016     Current Medications: Current Outpatient Prescriptions  Medication Sig Dispense  Refill  . ARIPiprazole (ABILIFY) 10 MG tablet Take 1 tablet (10 mg total) by mouth at bedtime. 90 tablet 0  . atorvastatin (LIPITOR) 40 MG tablet Take 1 tablet (40 mg total) by mouth daily. 90 tablet 3  . Desvenlafaxine ER 100 MG TB24 Take 1 tablet (100 mg total) by mouth daily. 30 tablet 5  . esomeprazole (NEXIUM) 40 MG capsule Take 1 capsule (40 mg total) by mouth daily. 90 capsule 1  . Fluticasone-Salmeterol (ADVAIR DISKUS) 250-50 MCG/DOSE AEPB Inhale 1 puff into the lungs 2 (two) times daily. 180 each 1  . gabapentin (NEURONTIN) 300 MG capsule Take 2 capsules (600 mg total) by mouth 3 (three) times daily. 180 capsule 3  . glucose blood (ONETOUCH VERIO) test strip Use as  instructed 100 each 12  . glyBURIDE (DIABETA) 5 MG tablet Take 2 tablets (10 mg total) by mouth daily with breakfast. 120 tablet 5  . levothyroxine (SYNTHROID, LEVOTHROID) 100 MCG tablet Take 1 tablet (100 mcg total) by mouth daily. 90 tablet 1  . lisinopril (PRINIVIL,ZESTRIL) 20 MG tablet Take 1 tablet (20 mg total) by mouth daily. 90 tablet 1  . metFORMIN (GLUCOPHAGE) 1000 MG tablet Take 1 tablet (1,000 mg total) by mouth 2 (two) times daily with a meal. 90 tablet 1  . methylphenidate (RITALIN LA) 40 MG 24 hr capsule Take 1 capsule (40 mg total) by mouth every morning. 90 capsule 0  . naproxen (NAPROSYN) 500 MG tablet Take 1 tablet (500 mg total) by mouth at bedtime. 30 tablet 5  . OVER THE COUNTER MEDICATION Iron-Take 1 tablet by mouth daily.    Marland Kitchen OVER THE COUNTER MEDICATION Calcium-Take 1 tablet by mouth daily.    Marland Kitchen OVER THE COUNTER MEDICATION Magnesium-Take 1 tablet by mouth daily.    . prazosin (MINIPRESS) 2 MG capsule Take 1 capsule (2 mg total) by mouth at bedtime. 90 capsule 1  . thiamine 100 MG tablet Take 100 mg by mouth daily.    . traMADol-acetaminophen (ULTRACET) 37.5-325 MG tablet Take 2 tablets by mouth at bedtime. 60 tablet 5   No current facility-administered medications for this visit.     Neurologic: Headache: Negative Seizure: Negative Paresthesias: Negative  Musculoskeletal: Strength & Muscle Tone: within normal limits Gait & Station: normal Patient leans: N/A  Psychiatric Specialty Exam: ROS  Blood pressure 128/72, pulse 88, height 5\' 2"  (1.575 m), weight 161 lb (73 kg).Body mass index is 29.45 kg/m.  General Appearance: Casual and Fairly Groomed  Eye Contact:  Fair  Speech:  Clear and Coherent  Volume:  Normal  Mood:  Depressed  Affect:  Congruent  Thought Process:  Goal Directed  Orientation:  Full (Time, Place, and Person)  Thought Content: Logical   Suicidal Thoughts:  No  Homicidal Thoughts:  No  Memory:  Immediate;   Fair  Judgement:  Fair   Insight:  Fair  Psychomotor Activity:  Normal  Concentration:  Concentration: Fair  Recall:  Good  Fund of Knowledge: Good  Language: Negative  Akathisia:  Negative  Handed:  Right  AIMS (if indicated):  0  Assets:  Communication Skills Desire for Improvement Financial Resources/Insurance Housing Intimacy Leisure Time Resilience Social Support  ADL's:  Intact  Cognition: WNL  Sleep:  6-7 hours    Treatment Plan Summary: Stephanie Travis is a 46 year old female with a psychiatric history consistent with bipolar disorder, and alcohol use disorder in sustained remission. She struggles with long-standing comorbid ADHD since childhood. She presents today for psychiatric follow-up.  No acute safety issues. She continues to struggle with depressive symptoms, and would benefit from an increase in Abilify. She will follow-up with writer in 8 weeks.  1. Attention deficit hyperactivity disorder (ADHD), unspecified ADHD type   2. Bipolar 1 disorder (HCC)    Increase Abilify to 10 mg 2 weeks, then 12.5 mg 2 weeks, then 15 mg thereafter Continue prazosin 4 mg nightly Gabapentin increased to 600 mg 3 times daily Continue Pristiq 100 mg daily Continue Ritalin 40 mg LA for ADHD  Aundra Dubin, MD 03/13/2017, 10:19 AM

## 2017-03-13 NOTE — Patient Instructions (Signed)
Continue Ritalin LA 40 mg daily  Increase Gabapentin to 600 mg (2 capsules) 3 times a day  Increase Abilify to the 10 mg tablet (1 tablet) In 2 weeks, Increase abilify to 12.5 mg (add in a 1/2 of a 5 mg tablet) After ANOTHER 2 weeks, increase Abilify to 15 mg daily  Continue Pristiq  Continue Prazosin 4 mg (2 capsules) at bedtime

## 2017-03-16 ENCOUNTER — Ambulatory Visit (HOSPITAL_BASED_OUTPATIENT_CLINIC_OR_DEPARTMENT_OTHER)
Admission: RE | Admit: 2017-03-16 | Discharge: 2017-03-16 | Disposition: A | Discharge status: Home / Self Care | Payer: BLUE CROSS/BLUE SHIELD | Source: Ambulatory Visit | Attending: Family Medicine | Admitting: Family Medicine

## 2017-03-16 DIAGNOSIS — M545 Low back pain: Secondary | ICD-10-CM | POA: Diagnosis not present

## 2017-03-16 DIAGNOSIS — M1288 Other specific arthropathies, not elsewhere classified, other specified site: Secondary | ICD-10-CM | POA: Diagnosis not present

## 2017-03-16 DIAGNOSIS — G8929 Other chronic pain: Secondary | ICD-10-CM

## 2017-03-26 ENCOUNTER — Encounter: Payer: Self-pay | Admitting: Family Medicine

## 2017-03-29 ENCOUNTER — Encounter: Payer: Self-pay | Admitting: Family Medicine

## 2017-03-30 ENCOUNTER — Other Ambulatory Visit: Payer: Self-pay | Admitting: Family Medicine

## 2017-03-30 DIAGNOSIS — E039 Hypothyroidism, unspecified: Secondary | ICD-10-CM

## 2017-04-01 NOTE — Telephone Encounter (Signed)
Rx approved and sent to the pharmacy by e-script.//AB/CMA 

## 2017-04-08 ENCOUNTER — Telehealth: Payer: Self-pay | Admitting: *Deleted

## 2017-04-08 ENCOUNTER — Ambulatory Visit: Payer: Self-pay | Admitting: Family Medicine

## 2017-04-08 DIAGNOSIS — M545 Low back pain, unspecified: Secondary | ICD-10-CM

## 2017-04-08 DIAGNOSIS — G8929 Other chronic pain: Secondary | ICD-10-CM

## 2017-04-08 MED ORDER — NAPROXEN 500 MG PO TABS: 500.0000 mg | ORAL_TABLET | Freq: Every day | ORAL | 0 refills | Status: DC

## 2017-04-08 NOTE — Telephone Encounter (Signed)
Requesting Naproxen 500mg -Take 1 tablet by mouth at bedtime.   Last refill:12/12/16;#30,0 Last OV:01/03/17-Has an appt scheduled for:04/10/17 Request is for 90 day supply. Please advise.//AB/CMA

## 2017-04-08 NOTE — Telephone Encounter (Signed)
Done

## 2017-04-09 ENCOUNTER — Encounter (HOSPITAL_COMMUNITY): Payer: Self-pay | Admitting: Psychiatry

## 2017-04-10 ENCOUNTER — Encounter: Payer: Self-pay | Admitting: Family Medicine

## 2017-04-10 ENCOUNTER — Other Ambulatory Visit (HOSPITAL_COMMUNITY): Payer: Self-pay | Admitting: Psychiatry

## 2017-04-10 ENCOUNTER — Ambulatory Visit (INDEPENDENT_AMBULATORY_CARE_PROVIDER_SITE_OTHER): Payer: BLUE CROSS/BLUE SHIELD | Admitting: Family Medicine

## 2017-04-10 VITALS — BP 138/70 | HR 92 | Temp 98.5°F | Ht 63.0 in | Wt 162.9 lb

## 2017-04-10 DIAGNOSIS — Z1283 Encounter for screening for malignant neoplasm of skin: Secondary | ICD-10-CM | POA: Diagnosis not present

## 2017-04-10 DIAGNOSIS — Z1231 Encounter for screening mammogram for malignant neoplasm of breast: Secondary | ICD-10-CM | POA: Diagnosis not present

## 2017-04-10 DIAGNOSIS — D489 Neoplasm of uncertain behavior, unspecified: Secondary | ICD-10-CM

## 2017-04-10 DIAGNOSIS — E119 Type 2 diabetes mellitus without complications: Secondary | ICD-10-CM

## 2017-04-10 DIAGNOSIS — F319 Bipolar disorder, unspecified: Secondary | ICD-10-CM | POA: Diagnosis not present

## 2017-04-10 DIAGNOSIS — R740 Nonspecific elevation of levels of transaminase and lactic acid dehydrogenase [LDH]: Secondary | ICD-10-CM

## 2017-04-10 DIAGNOSIS — R7401 Elevation of levels of liver transaminase levels: Secondary | ICD-10-CM

## 2017-04-10 DIAGNOSIS — F909 Attention-deficit hyperactivity disorder, unspecified type: Secondary | ICD-10-CM

## 2017-04-10 DIAGNOSIS — Z1239 Encounter for other screening for malignant neoplasm of breast: Secondary | ICD-10-CM

## 2017-04-10 LAB — IBC PANEL
IRON: 35 ug/dL — AB (ref 42–145)
Saturation Ratios: 8.3 % — ABNORMAL LOW (ref 20.0–50.0)
Transferrin: 301 mg/dL (ref 212.0–360.0)

## 2017-04-10 LAB — COMPREHENSIVE METABOLIC PANEL
ALT: 56 U/L — ABNORMAL HIGH (ref 0–35)
AST: 38 U/L — AB (ref 0–37)
Albumin: 4.1 g/dL (ref 3.5–5.2)
Alkaline Phosphatase: 99 U/L (ref 39–117)
BUN: 13 mg/dL (ref 6–23)
CALCIUM: 9.3 mg/dL (ref 8.4–10.5)
CO2: 28 mEq/L (ref 19–32)
CREATININE: 0.81 mg/dL (ref 0.40–1.20)
Chloride: 98 mEq/L (ref 96–112)
GFR: 81.67 mL/min (ref >60.00)
Glucose, Bld: 383 mg/dL — ABNORMAL HIGH (ref 70–99)
POTASSIUM: 4.6 meq/L (ref 3.5–5.1)
SODIUM: 134 meq/L — AB (ref 135–145)
TOTAL PROTEIN: 6.4 g/dL (ref 6.0–8.3)
Total Bilirubin: 0.3 mg/dL (ref 0.2–1.2)

## 2017-04-10 LAB — HEPATITIS C ANTIBODY: HCV AB: NEGATIVE

## 2017-04-10 LAB — FERRITIN: Ferritin: 10.2 ng/mL (ref 10.0–291.0)

## 2017-04-10 LAB — HEMOGLOBIN A1C: Hgb A1c MFr Bld: 11.5 % — ABNORMAL HIGH (ref 4.6–6.5)

## 2017-04-10 LAB — HEPATITIS B SURFACE ANTIGEN: Hepatitis B Surface Ag: NEGATIVE

## 2017-04-10 MED ORDER — METHYLPHENIDATE HCL ER (LA) 40 MG PO CP24: 40.0000 mg | ORAL_CAPSULE | ORAL | 0 refills | Status: DC

## 2017-04-10 NOTE — Progress Notes (Signed)
Subjective:   Chief Complaint  Patient presents with  . Follow-up    on DM  . Nevus    pt has moles on face she would like checked    Stephanie Travis is a 46 y.o. female here for follow-up of diabetes.   Keylee's self monitored glucose range is >200 Patient denies hypoglycemic reactions. She checks her glucose levels 1 times per wek. Patient does not require insulin.   Medications include: Metformin 1000 mg BID, glyburide 10 mg daily Patient exercises 5 days per week on average.   She does take an aspirin daily. Statin? Yes ACEi/ARB? Yes Gabapentin has been helpful for neuropathic pain but she is still having issues. Takes 600 mg BID. Tolerating well.  She would like to see a dietician.  Moles Has a painful area on her back and right leg that she wants looked at. Also has lesions that pop up on her hairline that she wants to make sure are not cancerous. She is interested in seeing a dermatologist for skin exams.    Mom was recently dx'd with breast cancer, final bx results and genetic testing TBD. Pt concerned if she needs testing also. She would like her mammogram to be scheduled in Aug as this will be when she is next due.  Past Medical History:  Diagnosis Date  . Asthma   . Bipolar 1 disorder ([place]) 12/13/2016  . Depression   . Diabetes mellitus without complication ([place])   . Hypertension   . Hypothyroid 12/13/2016  . Iron (Fe) deficiency anemia   . Pinched thoracic nerve root   . Thyroid disease   . Type 2 diabetes mellitus ([place]) 12/13/2016  . Ulcer     Past Surgical History:  Procedure Laterality Date  . CESAREAN SECTION     x 3  . EYE SURGERY     Laser x 2  . TUBAL LIGATION      Social History   Social History  . Marital status: Married   Social History Main Topics  . Smoking status: Former Smoker    Quit date: 12/12/2013  . Smokeless tobacco: Never Used  . Alcohol use Yes     Comment: Former alcoholic  . Drug use: No  . Sexual activity: Yes    Birth  control/ protection: None   Current Outpatient Prescriptions on File Prior to Visit  Medication Sig Dispense Refill  . ARIPiprazole (ABILIFY) 10 MG tablet Take 1 tablet (10 mg total) by mouth at bedtime. 90 tablet 0  . atorvastatin (LIPITOR) 40 MG tablet Take 1 tablet (40 mg total) by mouth daily. 90 tablet 3  . Desvenlafaxine ER 100 MG TB24 Take 1 tablet (100 mg total) by mouth daily. 30 tablet 5  . esomeprazole (NEXIUM) 40 MG capsule Take 1 capsule (40 mg total) by mouth daily. 90 capsule 1  . Fluticasone-Salmeterol (ADVAIR DISKUS) 250-50 MCG/DOSE AEPB Inhale 1 puff into the lungs 2 (two) times daily. 180 each 1  . gabapentin (NEURONTIN) 300 MG capsule Take 2 capsules (600 mg total) by mouth 3 (three) times daily. 180 capsule 3  . glucose blood (ONETOUCH VERIO) test strip Use as instructed 100 each 12  . glyBURIDE (DIABETA) 5 MG tablet Take 2 tablets (10 mg total) by mouth daily with breakfast. 120 tablet 5  . levothyroxine (SYNTHROID, LEVOTHROID) 100 MCG tablet TAKE 1 TABLET BY MOUTH DAILY 90 tablet 0  . lisinopril (PRINIVIL,ZESTRIL) 20 MG tablet Take 1 tablet (20 mg total) by mouth daily. 90 tablet  1  . metFORMIN (GLUCOPHAGE) 1000 MG tablet Take 1 tablet (1,000 mg total) by mouth 2 (two) times daily with a meal. 90 tablet 1  . naproxen (NAPROSYN) 500 MG tablet Take 1 tablet (500 mg total) by mouth at bedtime. 90 tablet 0  . OVER THE COUNTER MEDICATION Iron-Take 1 tablet by mouth daily.    Marland Kitchen OVER THE COUNTER MEDICATION Calcium-Take 1 tablet by mouth daily.    Marland Kitchen OVER THE COUNTER MEDICATION Magnesium-Take 1 tablet by mouth daily.    . prazosin (MINIPRESS) 2 MG capsule Take 1 capsule (2 mg total) by mouth at bedtime. 90 capsule 1  . thiamine 100 MG tablet Take 100 mg by mouth daily.    . traMADol-acetaminophen (ULTRACET) 37.5-325 MG tablet Take 2 tablets by mouth at bedtime. 60 tablet 5     Related testing: Foot exam(monofilament and inspection):done Date of retinal exam: 05/2016; has  provider in mind set up Pneumovax: done Flu Shot: done  Review of Systems: Eye:  No recent significant change in vision Pulmonary:  No SOB Cardiovascular:  No chest pain, no palpitations Skin/Integumentary ROS:  No abnormal skin lesions reported Objective:  BP 138/70 (BP Location: Left Arm, Patient Position: Sitting, Cuff Size: Normal)   Pulse 92   Temp 98.5 F (36.9 C) (Oral)   Ht '5\' 3"'$  (1.6 m)   Wt 162 lb 14.4 oz (73.9 kg)   LMP 04/04/2017 (Exact Date)   SpO2 98%   BMI 28.86 kg/m  General:  Well developed, well nourished, in no apparent distress Skin:  Warm, no pallor or diaphoresis; scaly and raised lesion on RLE, no TTP, fluctuance, erythema or drainage; there is a fleshy lesion on the lower back as well, no erythema, drainage or fluctuance Head:  Normocephalic, atraumatic Eyes:  Pupils equal and round, sclera anicteric without injection  Nose:  External nares without trauma, no discharge Throat/Pharynx:  Lips and gingiva without lesion Neck: Neck supple.  No obvious thyromegaly or masses.  No bruits Lungs:  clear to auscultation, breath sounds equal bilaterally, no wheezes, rales, or stridor Cardio:  regular rate and rhythm without murmurs, no bruits, no LE edema Abdomen:  Abdomen soft, non-tender, BS normal Musculoskeletal:  Symmetrical muscle groups noted without atrophy or deformity Neuro:  Sensation intact to pinprick on feet Psych: Age appropriate judgment and insight  Assessment:   Type 2 diabetes mellitus without complication, without long-term current use of insulin ([place]) - Plan: glyBURIDE (DIABETA) 5 MG tablet, Amb ref to Medical Nutrition Therapy-MNT, Hemoglobin A1c, Comprehensive metabolic panel, gabapentin (NEURONTIN) 300 MG capsule  Skin exam for malignant neoplasm - Plan: Ambulatory referral to Dermatology  Neoplasm of uncertain behavior  Screening for malignant neoplasm of breast - Plan: MM DIGITAL SCREENING BILATERAL  Elevated ALT measurement - Plan:  Hepatitis C antibody, Hepatitis B surface antigen, IBC panel, Ferritin, Comprehensive metabolic panel  Bipolar 1 disorder (HCC) - Plan: gabapentin (NEURONTIN) 300 MG capsule   Plan:   Orders as above. Increase Glyburide to 10 mg BID. Will add SGLT-2 receptor depending on response. Send to dietician. Counseled further on diet and exercise, challenged to her lift weights in addition to walking. If BP still elevated at next visit, will add HCTZ to lisinopril.  Increase dose of gabapentin from 600 mg BID to 900 mg BID as she is tolerating well and has noticed improvements. I would like to see results of mom's biopsy, will consider ordering BRCA1/2 vs genetic referral. Order mammogram to be scheduled in Aug of this  year.  F/u with me for removal of skin lesions on back and leg.  F/u in 3 mo for DM. Pt inquired about Freestyle Libra CGM, might be something to consider in future if we require insulin. It will likely cost her $40-75/mo. The patient voiced understanding and agreement to the plan.  Wallace, DO 04/10/17 1:06 PM

## 2017-04-10 NOTE — Patient Instructions (Signed)
If you do not hear anything about your referral in the next 1-2 weeks, call our office and ask for an update.

## 2017-04-12 ENCOUNTER — Encounter: Payer: Self-pay | Admitting: Family Medicine

## 2017-04-17 ENCOUNTER — Encounter: Payer: Self-pay | Admitting: Family Medicine

## 2017-05-07 ENCOUNTER — Ambulatory Visit: Payer: Self-pay | Admitting: Registered"

## 2017-05-09 ENCOUNTER — Encounter (HOSPITAL_COMMUNITY): Payer: Self-pay | Admitting: Psychiatry

## 2017-05-09 ENCOUNTER — Ambulatory Visit (INDEPENDENT_AMBULATORY_CARE_PROVIDER_SITE_OTHER): Payer: BLUE CROSS/BLUE SHIELD | Admitting: Psychiatry

## 2017-05-09 VITALS — BP 126/74 | HR 110 | Ht 63.0 in | Wt 162.0 lb

## 2017-05-09 DIAGNOSIS — Z87891 Personal history of nicotine dependence: Secondary | ICD-10-CM

## 2017-05-09 DIAGNOSIS — F319 Bipolar disorder, unspecified: Secondary | ICD-10-CM | POA: Diagnosis not present

## 2017-05-09 DIAGNOSIS — F909 Attention-deficit hyperactivity disorder, unspecified type: Secondary | ICD-10-CM

## 2017-05-09 DIAGNOSIS — F1021 Alcohol dependence, in remission: Secondary | ICD-10-CM

## 2017-05-09 DIAGNOSIS — Z818 Family history of other mental and behavioral disorders: Secondary | ICD-10-CM

## 2017-05-09 MED ORDER — ARIPIPRAZOLE 15 MG PO TABS: 15.0000 mg | ORAL_TABLET | Freq: Every day | ORAL | 0 refills | Status: DC

## 2017-05-09 MED ORDER — DESVENLAFAXINE ER 100 MG PO TB24: 100.0000 mg | ORAL_TABLET | Freq: Every day | ORAL | 0 refills | Status: DC

## 2017-05-09 MED ORDER — METHYLPHENIDATE HCL ER (LA) 40 MG PO CP24: 40.0000 mg | ORAL_CAPSULE | Freq: Every day | ORAL | 0 refills | Status: DC

## 2017-05-09 MED ORDER — METHYLPHENIDATE HCL ER (LA) 40 MG PO CP24: 40.0000 mg | ORAL_CAPSULE | ORAL | 0 refills | Status: DC

## 2017-05-09 NOTE — Patient Instructions (Signed)
Continue the Abilify 15 mg (new tablet) daily  Continue Ritalin LA 40 mg daily  Continue the gabapentin as it is  Come back in 8 weeks

## 2017-05-09 NOTE — Progress Notes (Signed)
[facility] MD/PA/NP OP Progress Note  05/09/2017 10:41 AM [name]  MRN:  [id number]  Chief Complaint: med management, bipolar  Subjective: Stephanie Travis presents today for med management follow-up. She is now on Abilify 15 mg. She has had less of the tearfulness and down and depressed mood. She reports that she continues to have difficulty getting motivated, and sometimes feels overwhelmed by the number of tasks she has on a day-to-day basis. 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 She reports that she had a good vacation to [place] last week, and was around her sister and in-laws. They drink quite heavily, but she was able to remain completely abstinent of alcohol. I applauded her efforts in this, and expressed my pride in her for being able to remain sober. She was able to internalize this and expressed pride in this as well.  Spent time discussing her medication regimen, and that Abilify will take another few weeks to get to steady state in her system. Would like to remain on the current regimen is unchanged for now. She continues on the Ritalin 40 mg daily as well. She denies any acute safety issues or suicidality. Agrees to follow-up in 8 weeks.  She has yet to get her driver's license, and this is been a limiting step for [name] arranging individual therapy, but once she gets this by the end of the year, we will be able to get her arranged with individual therapy. Transportation is a significant difficulty for her, and she is looking forward to being able to drive so that she can volunteer, get out of the house, because she realizes that she feels much better when she is active and doing things outside of the house.  Visit Diagnosis:    ICD-10-CM   1. Attention deficit hyperactivity disorder (ADHD), unspecified ADHD type F90.9   2. Bipolar 1 disorder (HCC) F31.9   3. Alcohol use disorder, severe, in sustained remission ([place]) F10.21     Past Psychiatric  History: See intake H&P for full details. Reviewed, with no updates at this time.  Past Medical History:  Past Medical History:  Diagnosis Date  . Asthma   . Bipolar 1 disorder ([name]) 12/13/2016  . Depression   . Diabetes mellitus without complication ([name])   . Hypertension   . Hypothyroid 12/13/2016  . Iron (Fe) deficiency anemia   . Pinched thoracic nerve root   . Thyroid disease   . Type 2 diabetes mellitus ([name]) 12/13/2016  . Ulcer     Past Surgical History:  Procedure Laterality Date  . CESAREAN SECTION     x 3  . EYE SURGERY     Laser x 2  . TUBAL LIGATION      Family Psychiatric History: See intake H&P for full details. Reviewed, with no updates at this time.   Family History:  Family History  Problem Relation Age of Onset  . Arthritis Mother   . Ulcers Mother   . Depression Father   . Diabetes Father   . Arthritis Father   . Hypertension Father   . Ulcers Sister   . ADD / ADHD Daughter   . Anxiety disorder Daughter   . Stroke Maternal Grandfather   . Alzheimer's disease Paternal Grandmother   . Depression Paternal Grandfather   . Bipolar disorder Paternal Grandfather     Social History:  Social History   Social History  . Marital status: Married    Spouse name:  N/A  . Number of children: N/A  . Years of education: N/A   Social History Main Topics  . Smoking status: Former Smoker    Quit date: 12/12/2013  . Smokeless tobacco: Never Used  . Alcohol use Yes     Comment: Former alcoholic  . Drug use: No  . Sexual activity: Yes    Birth control/ protection: None   Other Topics Concern  . None   Social History Narrative  . None    Allergies: No Known Allergies  Metabolic Disorder Labs: Lab Results  Component Value Date   HGBA1C 11.5 (H) 04/10/2017   No results found for: PROLACTIN Lab Results  Component Value Date   CHOL 184 12/18/2016   TRIG 270.0 (H) 12/18/2016   HDL 52.70 12/18/2016   CHOLHDL 3 12/18/2016   VLDL 54.0 (H)  12/18/2016     Current Medications: Current Outpatient Prescriptions  Medication Sig Dispense Refill  . ARIPiprazole (ABILIFY) 10 MG tablet Take 1 tablet (10 mg total) by mouth at bedtime. 90 tablet 0  . atorvastatin (LIPITOR) 40 MG tablet Take 1 tablet (40 mg total) by mouth daily. 90 tablet 3  . Desvenlafaxine ER 100 MG TB24 Take 1 tablet (100 mg total) by mouth daily. 30 tablet 5  . esomeprazole (NEXIUM) 40 MG capsule Take 1 capsule (40 mg total) by mouth daily. 90 capsule 1  . Fluticasone-Salmeterol (ADVAIR DISKUS) 250-50 MCG/DOSE AEPB Inhale 1 puff into the lungs 2 (two) times daily. 180 each 1  . gabapentin (NEURONTIN) 300 MG capsule Take 3 capsules (900 mg total) by mouth 2 (two) times daily. 180 capsule 3  . glucose blood (ONETOUCH VERIO) test strip Use as instructed 100 each 12  . glyBURIDE (DIABETA) 5 MG tablet Take 2 tablets (10 mg total) by mouth 2 (two) times daily with a meal. 120 tablet 5  . levothyroxine (SYNTHROID, LEVOTHROID) 100 MCG tablet TAKE 1 TABLET BY MOUTH DAILY 90 tablet 0  . lisinopril (PRINIVIL,ZESTRIL) 20 MG tablet Take 1 tablet (20 mg total) by mouth daily. 90 tablet 1  . metFORMIN (GLUCOPHAGE) 1000 MG tablet Take 1 tablet (1,000 mg total) by mouth 2 (two) times daily with a meal. 90 tablet 1  . methylphenidate (RITALIN LA) 40 MG 24 hr capsule Take 1 capsule (40 mg total) by mouth every morning. 30 capsule 0  . naproxen (NAPROSYN) 500 MG tablet Take 1 tablet (500 mg total) by mouth at bedtime. 90 tablet 0  . OVER THE COUNTER MEDICATION Iron-Take 1 tablet by mouth daily.    Marland Kitchen OVER THE COUNTER MEDICATION Calcium-Take 1 tablet by mouth daily.    Marland Kitchen OVER THE COUNTER MEDICATION Magnesium-Take 1 tablet by mouth daily.    . prazosin (MINIPRESS) 2 MG capsule Take 1 capsule (2 mg total) by mouth at bedtime. 90 capsule 1  . thiamine 100 MG tablet Take 100 mg by mouth daily.    . traMADol-acetaminophen (ULTRACET) 37.5-325 MG tablet Take 2 tablets by mouth at bedtime. 60  tablet 5   No current facility-administered medications for this visit.     Neurologic: Headache: Negative Seizure: Negative Paresthesias: Negative  Musculoskeletal: Strength & Muscle Tone: within normal limits Gait & Station: normal Patient leans: N/A  Psychiatric Specialty Exam: ROS  Blood pressure 126/74, pulse (!) 110, height 5\' 3"  (1.6 m), weight 162 lb (73.5 kg).Body mass index is 28.7 kg/m.  General Appearance: Casual and Fairly Groomed  Eye Contact:  Good  Speech:  Clear and Coherent  Volume:  Normal  Mood:  Euthymic  Affect:  Appropriate and Congruent  Thought Process:  Coherent and Goal Directed  Orientation:  Full (Time, Place, and Person)  Thought Content: Logical   Suicidal Thoughts:  No  Homicidal Thoughts:  No  Memory:  Immediate;   Good  Judgement:  Fair  Insight:  Shallow  Psychomotor Activity:  Normal  Concentration:  Concentration: Fair  Recall:  Good  Fund of Knowledge: Fair  Language: Good  Akathisia:  Negative  Handed:  Right  AIMS (if indicated):  0  Assets:  Communication Skills Desire for Improvement Financial Resources/Insurance Housing Social Support Transportation Vocational/Educational  ADL's:  Intact  Cognition: WNL  Sleep:  6-8 hours     Treatment Plan Summary: Kiele Heavrin is a 46 year old female with a history of bipolar disorder and alcohol use disorder in sustained remission, and addition to long-standing ADHD.  Now on Abilify 15 mg daily, and she seems to be having some reduction in her depressive symptoms. No acute safety issues.  We'll follow-up in 8 weeks.  1. Attention deficit hyperactivity disorder (ADHD), unspecified ADHD type   2. Bipolar 1 disorder ([place])   3. Alcohol use disorder, severe, in sustained remission (HCC)    Continue Abilify 15 mg daily Continue Pristiq 100 mg daily Continue Ritalin LA 40 mg daily Continue gabapentin 900 mg 2-3 times a day Return to clinic in 8 weeks  Aundra Dubin,  MD 05/09/2017, 10:41 AM

## 2017-05-13 ENCOUNTER — Other Ambulatory Visit: Payer: Self-pay | Admitting: *Deleted

## 2017-05-13 DIAGNOSIS — D485 Neoplasm of uncertain behavior of skin: Secondary | ICD-10-CM | POA: Diagnosis not present

## 2017-05-13 DIAGNOSIS — L820 Inflamed seborrheic keratosis: Secondary | ICD-10-CM | POA: Diagnosis not present

## 2017-05-13 DIAGNOSIS — E119 Type 2 diabetes mellitus without complications: Secondary | ICD-10-CM

## 2017-05-13 DIAGNOSIS — D225 Melanocytic nevi of trunk: Secondary | ICD-10-CM | POA: Diagnosis not present

## 2017-05-13 DIAGNOSIS — L814 Other melanin hyperpigmentation: Secondary | ICD-10-CM | POA: Diagnosis not present

## 2017-05-13 MED ORDER — GLYBURIDE 5 MG PO TABS: 10.0000 mg | ORAL_TABLET | Freq: Two times a day (BID) | ORAL | 5 refills | Status: DC

## 2017-05-13 NOTE — Telephone Encounter (Signed)
Rx sent to the pharmacy by e-script.//AB/CMA 

## 2017-05-14 ENCOUNTER — Encounter: Payer: Self-pay | Admitting: Registered"

## 2017-05-14 ENCOUNTER — Encounter: Payer: BLUE CROSS/BLUE SHIELD | Attending: Family Medicine | Admitting: Registered"

## 2017-05-14 DIAGNOSIS — E119 Type 2 diabetes mellitus without complications: Secondary | ICD-10-CM | POA: Diagnosis not present

## 2017-05-14 DIAGNOSIS — Z713 Dietary counseling and surveillance: Secondary | ICD-10-CM | POA: Diagnosis not present

## 2017-05-14 DIAGNOSIS — E118 Type 2 diabetes mellitus with unspecified complications: Secondary | ICD-10-CM

## 2017-05-14 NOTE — Progress Notes (Signed)
Diabetes Self-Management Education  Visit Type: First/Initial  Appt. Start Time: 1100 Appt. End Time: 1215  05/16/2017  Ms. Justice Britain, identified by name and date of birth, is a 46 y.o. female with a diagnosis of Diabetes: Type 2.   ASSESSMENT  Shops now for convience foods B complex, melatonin chews for sleep -  [place] nuts for thyroid Rate health as poor because, belly fat, eating habits, fatigue immobilized, depressed. Living on coffee      Diabetes Self-Management Education - 05/14/17 1121      Visit Information   Visit Type First/Initial     Initial Visit   Diabetes Type Type 2   Are you taking your medications as prescribed? No   Date Diagnosed 2013     Health Coping   How would you rate your overall health? Poor     Psychosocial Assessment   Patient Belief/Attitude about Diabetes Afraid   How often do you need to have someone help you when you read instructions, pamphlets, or other written materials from your doctor or pharmacy? 4 - Often   What is the last grade level you completed in school? grade 12     Complications   Last HgB A1C per patient/outside source 11.5 %   How often do you check your blood sugar? 0 times/day (not testing)  doesn't check because she knows it high   Have you had a dilated eye exam in the past 12 months? No   Have you had a dental exam in the past 12 months? Yes   Are you checking your feet? Yes   How many days per week are you checking your feet? 5     Dietary Intake   Breakfast cereal (frosted wheat), whole milk   Snack (morning) none    Lunch bread or bagels, cream cheese OR ham & cheese, grapes   Snack (afternoon) none OR banana OR yogurt OR chocolate   Dinner macaroni OR lasagne OR chicken, rice OR eat out Pizza or Cookout (cheap)   Snack (evening) none   Beverage(s) water (very thirsty), soda occassionally,      Exercise   Exercise Type Light (walking / raking leaves)   How many days per week to you exercise? 3   How many minutes per day do you exercise? 15   Total minutes per week of exercise 45     Patient Education   Previous Diabetes Education Yes (please comment)  2013 when first diagnosed   Disease state  Definition of diabetes, type 1 and 2, and the diagnosis of diabetes   Nutrition management  Role of diet in the treatment of diabetes and the relationship between the three main macronutrients and blood glucose level;Food label reading, portion sizes and measuring food.;Carbohydrate counting   Physical activity and exercise  Role of exercise on diabetes management, blood pressure control and cardiac health.   Monitoring Identified appropriate SMBG and/or A1C goals.   Chronic complications Relationship between chronic complications and blood glucose control;Assessed and discussed foot care and prevention of foot problems   Psychosocial adjustment Role of stress on diabetes     Individualized Goals (developed by patient)   Nutrition Follow meal plan discussed   Physical Activity Exercise 5-7 days per week     Outcomes   Expected Outcomes Demonstrated interest in learning. Expect positive outcomes   Future DMSE PRN   Program Status Completed    Individualized Plan for Diabetes Self-Management Training:   Learning Objective:  Patient will have  a greater understanding of diabetes self-management. Patient education plan is to attend individual and/or group sessions per assessed needs and concerns.  Patient Instructions  Breakfast smoothie idea: [place] Yogurt or Fairlife milk, 1/2 ripe banana, 1/2 c berries, 1 c greens (spinach, kale).  Use snack sheet for balance carb/protein choice  Aim for balanced meals.  [place] nuts: 2-3 nuts per day may help with Thyroid. All nuts have health benefits  Ideas for better sleep:  Get a bedtime routine to help wind down  Avoid caffeine later in the day including coffee, soda, black tea, green tea, chocolate   Consider Melatonin Extended Released  supplement  Magnesium supplement in the evening may help- Calm anti-stress drink, take minimal amount to begin, can also help with constipation  Avoid watching TV or looking at the computer 1 hr before going to sleep  Optimal room temperature for sleep 60-67 degrees fahrenheit  Avoid light in bedroom while sleeping, room darkening curtains may help  More information can be found at [email or web address]  Expected Outcomes:  Demonstrated interest in learning. Expect positive outcomes  Education material provided: Living Well with Diabetes, A1C conversion sheet, Snack sheet and Carbohydrate counting sheet  If problems or questions, patient to contact team via:  Phone and MyChart  Future DSME appointment: PRN

## 2017-05-14 NOTE — Patient Instructions (Addendum)
Breakfast smoothie idea: [place] Yogurt or Fairlife milk, 1/2 ripe banana, 1/2 c berries, 1 c greens (spinach, kale).  Use snack sheet for balance carb/protein choice  Aim for balanced meals.  [place] nuts: 2-3 nuts per day may help with Thyroid. All nuts have health benefits  Ideas for better sleep:  Get a bedtime routine to help wind down  Avoid caffeine later in the day including coffee, soda, black tea, green tea, chocolate   Consider Melatonin Extended Released supplement  Magnesium supplement in the evening may help- Calm anti-stress drink, take minimal amount to begin, can also help with constipation  Avoid watching TV or looking at the computer 1 hr before going to sleep  Optimal room temperature for sleep 60-67 degrees fahrenheit  Avoid light in bedroom while sleeping, room darkening curtains may help  More information can be found at [email or web address]

## 2017-05-27 ENCOUNTER — Ambulatory Visit (INDEPENDENT_AMBULATORY_CARE_PROVIDER_SITE_OTHER): Payer: BLUE CROSS/BLUE SHIELD | Admitting: Family Medicine

## 2017-05-27 ENCOUNTER — Encounter: Payer: Self-pay | Admitting: Family Medicine

## 2017-05-27 VITALS — BP 118/60 | HR 96 | Temp 98.0°F | Ht 63.0 in | Wt 161.2 lb

## 2017-05-27 DIAGNOSIS — E039 Hypothyroidism, unspecified: Secondary | ICD-10-CM | POA: Diagnosis not present

## 2017-05-27 DIAGNOSIS — E119 Type 2 diabetes mellitus without complications: Secondary | ICD-10-CM | POA: Diagnosis not present

## 2017-05-27 DIAGNOSIS — R0683 Snoring: Secondary | ICD-10-CM

## 2017-05-27 LAB — CBC
HCT: 41.6 % (ref 36.0–46.0)
HEMOGLOBIN: 13.3 g/dL (ref 12.0–15.0)
MCHC: 32 g/dL (ref 30.0–36.0)
MCV: 87.3 fl (ref 78.0–100.0)
PLATELETS: 295 10*3/uL (ref 150.0–400.0)
RBC: 4.76 Mil/uL (ref 3.87–5.11)
RDW: 15 % (ref 11.5–15.5)
WBC: 7.7 10*3/uL (ref 4.0–10.5)

## 2017-05-27 LAB — TSH: TSH: 2.82 u[IU]/mL (ref 0.35–4.50)

## 2017-05-27 MED ORDER — PIOGLITAZONE HCL 30 MG PO TABS: 30.0000 mg | ORAL_TABLET | Freq: Every day | ORAL | 2 refills | Status: DC

## 2017-05-27 NOTE — Patient Instructions (Addendum)
Give [name] 2-3 business days to get the results of your labs back.  Let us know if you need anything.   Keep up the good work with diet changes.  Think about the sleep study for yourself, definitely recommend your husband see his provider.  Come fasting to your next appointment (no calories for 9-12 hours prior to your appointment). Medicine is OK.

## 2017-05-27 NOTE — Progress Notes (Signed)
Chief Complaint  Patient presents with  . Follow-up    6 weeks of DM    Subjective: Patient is a 46 y.o. female here for DM f/u.  Her A1c in 04/10/17 was 11.5. She is currently taking metformin 1000 mg twice daily, glyburide 10 mg twice daily. She was a saw a nutritionist. She has lost 1 pound since her last visit. Notes that she has started to cut sugars out, she is going back to nutritionist. Sugars have been 200-300s, no lows. Maintains compliance with meds, tolerating well. She does walk intermittently.   ROS: Heart: Denies chest pain  Lungs: Denies SOB   Family History  Problem Relation Age of Onset  . Arthritis Mother   . Ulcers Mother   . Depression Father   . Diabetes Father   . Arthritis Father   . Hypertension Father   . Ulcers Sister   . ADD / ADHD Daughter   . Anxiety disorder Daughter   . Stroke Maternal Grandfather   . Alzheimer's disease Paternal Grandmother   . Depression Paternal Grandfather   . Bipolar disorder Paternal Grandfather    Past Medical History:  Diagnosis Date  . Asthma   . Bipolar 1 disorder ([place]) 12/13/2016  . Depression   . Diabetes mellitus without complication ([place])   . Hypertension   . Hypothyroid 12/13/2016  . Iron (Fe) deficiency anemia   . Pinched thoracic nerve root   . Thyroid disease   . Type 2 diabetes mellitus ([place]) 12/13/2016  . Ulcer    No Known Allergies  Current Outpatient Prescriptions:  .  ARIPiprazole (ABILIFY) 15 MG tablet, Take 1 tablet (15 mg total) by mouth daily., Disp: 90 tablet, Rfl: 0 .  atorvastatin (LIPITOR) 40 MG tablet, Take 1 tablet (40 mg total) by mouth daily., Disp: 90 tablet, Rfl: 3 .  Desvenlafaxine ER 100 MG TB24, Take 1 tablet (100 mg total) by mouth daily., Disp: 90 tablet, Rfl: 0 .  esomeprazole (NEXIUM) 40 MG capsule, Take 1 capsule (40 mg total) by mouth daily., Disp: 90 capsule, Rfl: 1 .  Fluticasone-Salmeterol (ADVAIR DISKUS) 250-50 MCG/DOSE AEPB, Inhale 1 puff into the lungs 2 (two) times  daily., Disp: 180 each, Rfl: 1 .  gabapentin (NEURONTIN) 300 MG capsule, Take 3 capsules (900 mg total) by mouth 2 (two) times daily., Disp: 180 capsule, Rfl: 3 .  glucose blood (ONETOUCH VERIO) test strip, Use as instructed, Disp: 100 each, Rfl: 12 .  glyBURIDE (DIABETA) 5 MG tablet, Take 2 tablets (10 mg total) by mouth 2 (two) times daily with a meal., Disp: 120 tablet, Rfl: 5 .  levothyroxine (SYNTHROID, LEVOTHROID) 100 MCG tablet, TAKE 1 TABLET BY MOUTH DAILY, Disp: 90 tablet, Rfl: 0 .  lisinopril (PRINIVIL,ZESTRIL) 20 MG tablet, Take 1 tablet (20 mg total) by mouth daily., Disp: 90 tablet, Rfl: 1 .  metFORMIN (GLUCOPHAGE) 1000 MG tablet, Take 1 tablet (1,000 mg total) by mouth 2 (two) times daily with a meal., Disp: 90 tablet, Rfl: 1 .  [START ON 07/04/2017] methylphenidate (RITALIN LA) 40 MG 24 hr capsule, Take 1 capsule (40 mg total) by mouth daily., Disp: 30 capsule, Rfl: 0 .  naproxen (NAPROSYN) 500 MG tablet, Take 1 tablet (500 mg total) by mouth at bedtime., Disp: 90 tablet, Rfl: 0 .  OVER THE COUNTER MEDICATION, Iron-Take 1 tablet by mouth daily., Disp: , Rfl:  .  OVER THE COUNTER MEDICATION, Calcium-Take 1 tablet by mouth daily., Disp: , Rfl:  .  OVER THE COUNTER  MEDICATION, Magnesium-Take 1 tablet by mouth daily., Disp: , Rfl:  .  prazosin (MINIPRESS) 2 MG capsule, Take 1 capsule (2 mg total) by mouth at bedtime., Disp: 90 capsule, Rfl: 1 .  thiamine 100 MG tablet, Take 100 mg by mouth daily., Disp: , Rfl:  .  traMADol-acetaminophen (ULTRACET) 37.5-325 MG tablet, Take 2 tablets by mouth at bedtime., Disp: 60 tablet, Rfl: 5 .  pioglitazone (ACTOS) 30 MG tablet, Take 1 tablet (30 mg total) by mouth daily., Disp: 30 tablet, Rfl: 2  Objective: BP 118/60 (BP Location: Left Arm, Patient Position: Sitting, Cuff Size: Normal)   Pulse 96   Temp 98 F (36.7 C) (Oral)   Ht 5\' 3"  (1.6 m)   Wt 161 lb 3.2 oz (73.1 kg)   LMP 05/05/2017 (Approximate)   SpO2 98%   BMI 28.56 kg/m  General:  Awake, appears stated age Heart: RRR, no LE edema Lungs: CTAB, no rales, wheezes or rhonchi. No accessory muscle use Psych: Age appropriate judgment and insight, normal affect and mood  Assessment and Plan: Type 2 diabetes mellitus without complication, without long-term current use of insulin (HCC) - Plan: pioglitazone (ACTOS) 30 MG tablet, CBC  Snoring  Hypothyroidism, unspecified type - Plan: TSH  Orders as above. Add Actos. Did discuss insulin, which she would like to avoid at all costs. Offered referral to sleep specialist, but she would like to discuss with her husband.  Counseled on diet and exercise, she is starting to make changes, but is just one week in. F/u in 6 weeks. The patient voiced understanding and agreement to the plan.  [place], DO 05/27/17  9:51 AM

## 2017-06-08 ENCOUNTER — Other Ambulatory Visit (HOSPITAL_COMMUNITY): Payer: Self-pay | Admitting: Psychiatry

## 2017-06-08 DIAGNOSIS — F319 Bipolar disorder, unspecified: Secondary | ICD-10-CM

## 2017-06-11 ENCOUNTER — Encounter (HOSPITAL_COMMUNITY): Payer: Self-pay | Admitting: Psychiatry

## 2017-06-12 ENCOUNTER — Other Ambulatory Visit: Payer: Self-pay | Admitting: Family Medicine

## 2017-06-13 NOTE — Telephone Encounter (Signed)
Rx approved and sent to the pharmacy by e-script.//AB/CMA 

## 2017-06-14 ENCOUNTER — Telehealth: Payer: Self-pay | Admitting: Family Medicine

## 2017-06-14 DIAGNOSIS — F319 Bipolar disorder, unspecified: Secondary | ICD-10-CM

## 2017-06-14 DIAGNOSIS — E119 Type 2 diabetes mellitus without complications: Secondary | ICD-10-CM

## 2017-06-14 DIAGNOSIS — J4540 Moderate persistent asthma, uncomplicated: Secondary | ICD-10-CM

## 2017-06-14 NOTE — Telephone Encounter (Signed)
Pharmacy changed.//AB/CMA

## 2017-06-14 NOTE — Telephone Encounter (Signed)
Relation to NK:NLZJ Call back [phone] [facility]  Reason for call:  Patient would like all medications [facility] from this point on, please advise

## 2017-06-17 MED ORDER — ATORVASTATIN CALCIUM 40 MG PO TABS: 40.0000 mg | ORAL_TABLET | Freq: Every day | ORAL | 3 refills | Status: DC

## 2017-06-17 NOTE — Telephone Encounter (Signed)
Sent in atorvastatin to [facility].

## 2017-06-17 NOTE — Addendum Note (Signed)
Addended by: Sharon Seller B on: 06/17/2017 10:53 AM   Modules accepted: Orders

## 2017-06-18 ENCOUNTER — Ambulatory Visit: Payer: Self-pay | Admitting: Registered"

## 2017-06-18 ENCOUNTER — Other Ambulatory Visit: Payer: Self-pay | Admitting: Family Medicine

## 2017-06-18 DIAGNOSIS — E119 Type 2 diabetes mellitus without complications: Secondary | ICD-10-CM

## 2017-06-18 DIAGNOSIS — F319 Bipolar disorder, unspecified: Secondary | ICD-10-CM

## 2017-06-18 DIAGNOSIS — M545 Low back pain: Secondary | ICD-10-CM

## 2017-06-18 DIAGNOSIS — E039 Hypothyroidism, unspecified: Secondary | ICD-10-CM

## 2017-06-18 DIAGNOSIS — G8929 Other chronic pain: Secondary | ICD-10-CM

## 2017-06-18 NOTE — Telephone Encounter (Signed)
Pharmacy ([facility] ) request refill for pt's medication. Pt switched her pharmacy.   1. gabapentin 2. glucose blood  3.esomeprazole 4.glyBURIDE 5.levothyroxine  6.lisinopril  7.metFORMIN  8.naproxen 9.pioglitazone  CB: [phone]

## 2017-06-19 MED ORDER — METFORMIN HCL 1000 MG PO TABS: 1000.0000 mg | ORAL_TABLET | Freq: Two times a day (BID) | ORAL | 1 refills | Status: DC

## 2017-06-19 MED ORDER — LEVOTHYROXINE SODIUM 100 MCG PO TABS: 100.0000 ug | ORAL_TABLET | Freq: Every day | ORAL | 0 refills | Status: DC

## 2017-06-19 MED ORDER — GLUCOSE BLOOD VI STRP: ORAL_STRIP | 12 refills | Status: DC

## 2017-06-19 MED ORDER — GABAPENTIN 300 MG PO CAPS
900.0000 mg | ORAL_CAPSULE | Freq: Two times a day (BID) | ORAL | 3 refills | Status: DC
Start: 2017-06-19 — End: 2017-08-28

## 2017-06-19 MED ORDER — PIOGLITAZONE HCL 30 MG PO TABS: 30.0000 mg | ORAL_TABLET | Freq: Every day | ORAL | 1 refills | Status: DC

## 2017-06-19 MED ORDER — LISINOPRIL 20 MG PO TABS
20.0000 mg | ORAL_TABLET | Freq: Every day | ORAL | 1 refills | Status: DC
Start: 2017-06-19 — End: 2017-10-23

## 2017-06-19 MED ORDER — GLYBURIDE 5 MG PO TABS: 10.0000 mg | ORAL_TABLET | Freq: Two times a day (BID) | ORAL | 5 refills | Status: DC

## 2017-06-19 MED ORDER — ESOMEPRAZOLE MAGNESIUM 40 MG PO CPDR: 40.0000 mg | DELAYED_RELEASE_CAPSULE | Freq: Every day | ORAL | 1 refills | Status: DC

## 2017-06-19 MED ORDER — NAPROXEN 500 MG PO TABS: 500.0000 mg | ORAL_TABLET | Freq: Every day | ORAL | 0 refills | Status: DC

## 2017-06-19 NOTE — Telephone Encounter (Signed)
Have refilled all requested to [facility]--Gabapentin you had not filled before--advise

## 2017-06-23 ENCOUNTER — Encounter (HOSPITAL_COMMUNITY): Payer: Self-pay | Admitting: Psychiatry

## 2017-06-25 ENCOUNTER — Telehealth: Payer: Self-pay | Admitting: Family Medicine

## 2017-06-25 DIAGNOSIS — E119 Type 2 diabetes mellitus without complications: Secondary | ICD-10-CM

## 2017-06-25 NOTE — Telephone Encounter (Signed)
Caller name: Marianna Fuss  Relation to pt: from [place]  Call back number: [phone]  Pharmacy:  Reason for call:,  [facility] stated they have called several times requesting instruction/direction regarding glucose blood (ONETOUCH VERIO) test strip , advised PCP is out of the office and will follow up.  Requesting

## 2017-06-26 MED ORDER — GLUCOSE BLOOD VI STRP: ORAL_STRIP | 12 refills | Status: DC

## 2017-06-26 NOTE — Telephone Encounter (Signed)
Rx sent to the pharmacy by e-script.//AB/CMA 

## 2017-06-28 ENCOUNTER — Encounter: Payer: Self-pay | Admitting: Family Medicine

## 2017-06-28 DIAGNOSIS — J4540 Moderate persistent asthma, uncomplicated: Secondary | ICD-10-CM

## 2017-06-28 MED ORDER — FLUTICASONE-SALMETEROL 250-50 MCG/DOSE IN AEPB: 1.0000 | INHALATION_SPRAY | Freq: Two times a day (BID) | RESPIRATORY_TRACT | 1 refills | Status: DC

## 2017-06-28 MED ORDER — GLUCOSE BLOOD VI STRP: ORAL_STRIP | 2 refills | Status: DC

## 2017-06-28 NOTE — Telephone Encounter (Signed)
Received refills and refilled to [facility]/But patient did call to inform she does not want to use them anymore. So called [facility]/and did cancel that order and sent to local [facility]

## 2017-06-28 NOTE — Addendum Note (Signed)
Addended by: Sharon Seller B on: 06/28/2017 12:43 PM   Modules accepted: Orders

## 2017-07-02 ENCOUNTER — Encounter (HOSPITAL_BASED_OUTPATIENT_CLINIC_OR_DEPARTMENT_OTHER): Payer: Self-pay

## 2017-07-02 ENCOUNTER — Ambulatory Visit (HOSPITAL_BASED_OUTPATIENT_CLINIC_OR_DEPARTMENT_OTHER)
Admission: RE | Admit: 2017-07-02 | Discharge: 2017-07-02 | Disposition: A | Discharge status: Home / Self Care | Payer: BLUE CROSS/BLUE SHIELD | Source: Ambulatory Visit | Attending: Family Medicine | Admitting: Family Medicine

## 2017-07-02 DIAGNOSIS — Z1231 Encounter for screening mammogram for malignant neoplasm of breast: Secondary | ICD-10-CM | POA: Insufficient documentation

## 2017-07-02 DIAGNOSIS — Z1239 Encounter for other screening for malignant neoplasm of breast: Secondary | ICD-10-CM

## 2017-07-04 ENCOUNTER — Ambulatory Visit (INDEPENDENT_AMBULATORY_CARE_PROVIDER_SITE_OTHER): Payer: BLUE CROSS/BLUE SHIELD | Admitting: Psychiatry

## 2017-07-04 ENCOUNTER — Encounter (HOSPITAL_COMMUNITY): Payer: Self-pay | Admitting: Psychiatry

## 2017-07-04 DIAGNOSIS — F609 Personality disorder, unspecified: Secondary | ICD-10-CM

## 2017-07-04 DIAGNOSIS — F909 Attention-deficit hyperactivity disorder, unspecified type: Secondary | ICD-10-CM

## 2017-07-04 DIAGNOSIS — F4310 Post-traumatic stress disorder, unspecified: Secondary | ICD-10-CM | POA: Diagnosis not present

## 2017-07-04 DIAGNOSIS — Z87891 Personal history of nicotine dependence: Secondary | ICD-10-CM | POA: Diagnosis not present

## 2017-07-04 DIAGNOSIS — F319 Bipolar disorder, unspecified: Secondary | ICD-10-CM

## 2017-07-04 DIAGNOSIS — F39 Unspecified mood [affective] disorder: Secondary | ICD-10-CM | POA: Diagnosis not present

## 2017-07-04 DIAGNOSIS — Z818 Family history of other mental and behavioral disorders: Secondary | ICD-10-CM | POA: Diagnosis not present

## 2017-07-04 DIAGNOSIS — Z810 Family history of intellectual disabilities: Secondary | ICD-10-CM | POA: Diagnosis not present

## 2017-07-04 MED ORDER — METHYLPHENIDATE HCL ER (LA) 40 MG PO CP24: 40.0000 mg | ORAL_CAPSULE | Freq: Every day | ORAL | 0 refills | Status: DC

## 2017-07-04 MED ORDER — DESVENLAFAXINE ER 100 MG PO TB24: 100.0000 mg | ORAL_TABLET | Freq: Every day | ORAL | 0 refills | Status: DC

## 2017-07-04 MED ORDER — METHYLPHENIDATE HCL ER (LA) 40 MG PO CP24: 40.0000 mg | ORAL_CAPSULE | ORAL | 0 refills | Status: DC

## 2017-07-04 MED ORDER — BREXPIPRAZOLE 2 MG PO TABS: 2.0000 mg | ORAL_TABLET | Freq: Every day | ORAL | 0 refills | Status: DC

## 2017-07-04 MED ORDER — PRAZOSIN HCL 2 MG PO CAPS: 2.0000 mg | ORAL_CAPSULE | Freq: Every day | ORAL | 1 refills | Status: DC

## 2017-07-04 NOTE — Patient Instructions (Signed)
Take 1/2 tablet of abilify (7.5 mg) for 6 days WHILE you are taking 1/2 tablet of Rexulti  After 6 days, STOP Abilify and increase to the whole tablet of Rexulti 2 mg tablet  AFTER 1-2 weeks, let me know if you feel like the 2 mg tablet dose the trick, or if you would like to go up to 3 mg

## 2017-07-04 NOTE — Progress Notes (Signed)
[facility] MD/PA/NP OP Progress Note  07/04/2017 10:36 AM Stephanie Travis  MRN:  [id number]  Chief Complaint: med check  HPI: Stephanie Travis presents today for med management follow-up. She reports that things are going fairly well in terms of her attention, but she continues to be a little bit down in terms of her mood and anxiety symptoms. She feels foggy in terms of her thinking. We spent time discussing that this may be due to the up titration of Abilify, because these symptoms do seem to worsen for her as we go up on Abilify. She has had good mood stability with aripiprazole, so we discussed switching to [place]. We reviewed the risks and benefits, and possibility that Rexulti may not work as well for her as Abilify, and if so she can switch back to Abilify. We discussed a cross titration plan. She denies any acute safety issues. She remains completely abstinent of alcohol, and I spent time applauding her commitment to her abstinence.  She reports that she continues to work on her visa, and citizenship status. She continues to do some housework to spend her days busy and active, but notes that she feels fatigued and exhausted by 11:00 in the morning, and she ends up needing to spend the rest of the day resting or sleeping. His is fairly demoralizing for her, she is hopeful that [place] will be less sedating than Abilify. We agreed to follow up in 10 weeks, and she will check in with writer BMI chart in the interim.  Visit Diagnosis:    ICD-10-CM   1. Bipolar 1 disorder (HCC) F31.9 Desvenlafaxine ER 100 MG TB24    prazosin (MINIPRESS) 2 MG capsule  2. Attention deficit hyperactivity disorder (ADHD), unspecified ADHD type F90.9 methylphenidate (RITALIN LA) 40 MG 24 hr capsule    Past Psychiatric History: See intake H&P for full details. Reviewed, with no updates at this time.  Past Medical History:  Past Medical History:  Diagnosis Date  . Asthma   . Bipolar 1 disorder ([place]) 12/13/2016  . Depression    . Diabetes mellitus without complication ([place])   . Hypertension   . Hypothyroid 12/13/2016  . Iron (Fe) deficiency anemia   . Pinched thoracic nerve root   . Thyroid disease   . Type 2 diabetes mellitus ([place]) 12/13/2016  . Ulcer     Past Surgical History:  Procedure Laterality Date  . CESAREAN SECTION     x 3  . EYE SURGERY     Laser x 2  . TUBAL LIGATION      Family Psychiatric History: See intake H&P for full details. Reviewed, with no updates at this time.   Family History:  Family History  Problem Relation Age of Onset  . Arthritis Mother   . Ulcers Mother   . Depression Father   . Diabetes Father   . Arthritis Father   . Hypertension Father   . Ulcers Sister   . ADD / ADHD Daughter   . Anxiety disorder Daughter   . Stroke Maternal Grandfather   . Alzheimer's disease Paternal Grandmother   . Depression Paternal Grandfather   . Bipolar disorder Paternal Grandfather     Social History:  Social History   Social History  . Marital status: Married    Spouse name: N/A  . Number of children: N/A  . Years of education: N/A   Social History Main Topics  . Smoking status: Former Smoker    Quit date: 12/12/2013  . Smokeless tobacco:  Never Used  . Alcohol use Yes     Comment: Former alcoholic  . Drug use: No  . Sexual activity: Yes    Birth control/ protection: None   Other Topics Concern  . None   Social History Narrative  . None    Allergies: No Known Allergies  Metabolic Disorder Labs: Lab Results  Component Value Date   HGBA1C 11.5 (H) 04/10/2017   No results found for: PROLACTIN Lab Results  Component Value Date   CHOL 184 12/18/2016   TRIG 270.0 (H) 12/18/2016   HDL 52.70 12/18/2016   CHOLHDL 3 12/18/2016   VLDL 54.0 (H) 12/18/2016   Lab Results  Component Value Date   TSH 2.82 05/27/2017    Therapeutic Level Labs: No results found for: LITHIUM No results found for: VALPROATE No components found for:  CBMZ  Current  Medications: Current Outpatient Prescriptions  Medication Sig Dispense Refill  . atorvastatin (LIPITOR) 40 MG tablet Take 1 tablet (40 mg total) by mouth daily. 90 tablet 3  . Desvenlafaxine ER 100 MG TB24 Take 1 tablet (100 mg total) by mouth daily. 90 tablet 0  . esomeprazole (NEXIUM) 40 MG capsule Take 1 capsule (40 mg total) by mouth daily. 90 capsule 1  . Fluticasone-Salmeterol (ADVAIR DISKUS) 250-50 MCG/DOSE AEPB Inhale 1 puff into the lungs 2 (two) times daily. 3 each 1  . gabapentin (NEURONTIN) 300 MG capsule Take 3 capsules (900 mg total) by mouth 2 (two) times daily. 180 capsule 3  . glucose blood (ONETOUCH VERIO) test strip Use as instructed 100 each 12  . glucose blood test strip Test as directed once daily to check blood sugar. 100 each 2  . glyBURIDE (DIABETA) 5 MG tablet Take 2 tablets (10 mg total) by mouth 2 (two) times daily with a meal. 120 tablet 5  . levothyroxine (SYNTHROID, LEVOTHROID) 100 MCG tablet Take 1 tablet (100 mcg total) by mouth daily. 90 tablet 0  . lisinopril (PRINIVIL,ZESTRIL) 20 MG tablet Take 1 tablet (20 mg total) by mouth daily. 90 tablet 1  . metFORMIN (GLUCOPHAGE) 1000 MG tablet Take 1 tablet (1,000 mg total) by mouth 2 (two) times daily with a meal. 180 tablet 1  . methylphenidate (RITALIN LA) 40 MG 24 hr capsule Take 1 capsule (40 mg total) by mouth daily. 30 capsule 0  . naproxen (NAPROSYN) 500 MG tablet Take 1 tablet (500 mg total) by mouth at bedtime. 90 tablet 0  . OVER THE COUNTER MEDICATION Iron-Take 1 tablet by mouth daily.    Marland Kitchen OVER THE COUNTER MEDICATION Calcium-Take 1 tablet by mouth daily.    Marland Kitchen OVER THE COUNTER MEDICATION Magnesium-Take 1 tablet by mouth daily.    . pioglitazone (ACTOS) 30 MG tablet Take 1 tablet (30 mg total) by mouth daily. 90 tablet 1  . prazosin (MINIPRESS) 2 MG capsule Take 1 capsule (2 mg total) by mouth at bedtime. 90 capsule 1  . thiamine 100 MG tablet Take 100 mg by mouth daily.    . Brexpiprazole (REXULTI) 2 MG  TABS Take 2 mg by mouth at bedtime. 90 tablet 0  . [START ON 08/03/2017] methylphenidate (RITALIN LA) 40 MG 24 hr capsule Take 1 capsule (40 mg total) by mouth every morning. 30 capsule 0  . [START ON 09/02/2017] methylphenidate (RITALIN LA) 40 MG 24 hr capsule Take 1 capsule (40 mg total) by mouth daily. 30 capsule 0  . traMADol-acetaminophen (ULTRACET) 37.5-325 MG tablet Take 2 tablets by mouth at bedtime. (Patient not  taking: Reported on 07/04/2017) 60 tablet 5   No current facility-administered medications for this visit.      Musculoskeletal: Strength & Muscle Tone: within normal limits Gait & Station: normal Patient leans: N/A  Psychiatric Specialty Exam: ROS  Blood pressure (!) 135/96, pulse 95, height 5\' 4"  (1.626 m), weight 160 lb 9.6 oz (72.8 kg), last menstrual period 06/11/2017.Body mass index is 27.57 kg/m.  General Appearance: Casual and Well Groomed  Eye Contact:  Good  Speech:  Clear and Coherent  Volume:  Normal  Mood:  Dysphoric and Euthymic  Affect:  Appropriate and Congruent  Thought Process:  Goal Directed  Orientation:  Full (Time, Place, and Person)  Thought Content: Logical   Suicidal Thoughts:  No  Homicidal Thoughts:  No  Memory:  Immediate;   Good  Judgement:  Fair  Insight:  Fair  Psychomotor Activity:  Normal  Concentration:  Concentration: Fair  Recall:  [place] of Knowledge: Fair  Language: Fair  Akathisia:  Negative  Handed:  Right  AIMS (if indicated): not done  Assets:  Communication Skills Desire for Improvement Financial Resources/Insurance Housing Intimacy Social Support Vocational/Educational  ADL's:  Intact  Cognition: WNL  Sleep:  Fair   Screenings: PHQ2-9     Nutrition from 05/14/2017 in [facility] Visit from 04/10/2017 in [facility] at [employer] [facility]  PHQ-2 Total Score  6  6  PHQ-9 Total Score  -  19      Assessment and Plan: Lache Dagher is a 46 year old  female with a history of mood disorder unspecified, chronic and complex PTSD and personality disorder. She has alcohol use disorder in sustained remission, and a history of ADHD since childhood.  She recently moved here from [place] with her husband and family.  She has good support from her extended family who are local.  She presents today for medication management follow-up for depressive symptoms. She reports that her attention and focus seem to be well managed, but she continues to struggle with dysphoria, poor self-esteem, anxiety, low motivation. Spent time discussing the possibility that the high dose Abilify has contributed to her symptoms of sedation, we discussed a switch to [place]. No acute safety issues and will follow up in 10 weeks.  1. Bipolar 1 disorder ([place])   2. Attention deficit hyperactivity disorder (ADHD), unspecified ADHD type    Decrease Abilify to 7.5 mg daily for 1 week, then stop Initiate Rexulti 1 mg daily for 1 week, then increase to 2 mg daily Continue Pristiq 100 mg daily Continue Ritalin LA 40 mg daily Continue prazosin for sleep and nightmares related to PTSD Continue gabapentin 900 2-3 times per day for alcohol cravings, anxiety Return to clinic in 10 weeks  Aundra Dubin, MD 07/04/2017, 10:36 AM

## 2017-07-08 ENCOUNTER — Encounter: Payer: Self-pay | Admitting: Family Medicine

## 2017-07-08 ENCOUNTER — Ambulatory Visit (INDEPENDENT_AMBULATORY_CARE_PROVIDER_SITE_OTHER): Payer: BLUE CROSS/BLUE SHIELD | Admitting: Family Medicine

## 2017-07-08 VITALS — BP 124/78 | HR 93 | Temp 98.1°F | Wt 160.4 lb

## 2017-07-08 DIAGNOSIS — Z23 Encounter for immunization: Secondary | ICD-10-CM | POA: Diagnosis not present

## 2017-07-08 DIAGNOSIS — E119 Type 2 diabetes mellitus without complications: Secondary | ICD-10-CM | POA: Diagnosis not present

## 2017-07-08 MED ORDER — DAPAGLIFLOZIN PROPANEDIOL 5 MG PO TABS: 5.0000 mg | ORAL_TABLET | Freq: Every day | ORAL | 3 refills | Status: DC

## 2017-07-08 NOTE — Patient Instructions (Addendum)
Keep up the great work. Keep checking your sugars.   [place] for your diabetic eye exam, let us know if you need a referral and we will place one for you.   You don't need to be fasting for your follow up lab on 07/22/17.    If the new medicine is too expensive, do not fill it and let me know.   Let us know if you need anything.

## 2017-07-08 NOTE — Progress Notes (Signed)
Subjective:   Chief Complaint  Patient presents with  . Diabetes    follow up     Stephanie Travis is a 46 y.o. female here for follow-up of diabetes.   Stephanie Travis's self monitored glucose range is mid-high 100's, low 200's Last A1c was 11.5 in 03/2017, fasting sugars have been decreasing Patient denies hypoglycemic reactions. She checks her glucose levels 1 times per day. Patient does not require insulin.   Medications include: Metformin 1000 mg BID, Glyburide 10 mg daily, Actos 30 mg daily  Patient exercises 5 days per week on average. Physically active.  Statin? Yes ACEi/ARB? Yes  Past Medical History:  Diagnosis Date  . Asthma   . Bipolar 1 disorder ([place]) 12/13/2016  . Depression   . Diabetes mellitus without complication ([place])   . Hypertension   . Hypothyroid 12/13/2016  . Iron (Fe) deficiency anemia   . Pinched thoracic nerve root   . Thyroid disease   . Type 2 diabetes mellitus ([place]) 12/13/2016  . Ulcer     Past Surgical History:  Procedure Laterality Date  . CESAREAN SECTION     x 3  . EYE SURGERY     Laser x 2  . TUBAL LIGATION      Social History   Social History  . Marital status: Married   Social History Main Topics  . Smoking status: Former Smoker    Quit date: 12/12/2013  . Smokeless tobacco: Never Used  . Alcohol use Yes     Comment: Former alcoholic  . Drug use: No  . Sexual activity: Yes    Birth control/ protection: None   Current Outpatient Prescriptions on File Prior to Visit  Medication Sig Dispense Refill  . atorvastatin (LIPITOR) 40 MG tablet Take 1 tablet (40 mg total) by mouth daily. 90 tablet 3  . Brexpiprazole (REXULTI) 2 MG TABS Take 2 mg by mouth at bedtime. 90 tablet 0  . Desvenlafaxine ER 100 MG TB24 Take 1 tablet (100 mg total) by mouth daily. 90 tablet 0  . esomeprazole (NEXIUM) 40 MG capsule Take 1 capsule (40 mg total) by mouth daily. 90 capsule 1  . Fluticasone-Salmeterol (ADVAIR DISKUS) 250-50 MCG/DOSE AEPB Inhale 1 puff into the  lungs 2 (two) times daily. 3 each 1  . gabapentin (NEURONTIN) 300 MG capsule Take 3 capsules (900 mg total) by mouth 2 (two) times daily. 180 capsule 3  . glucose blood (ONETOUCH VERIO) test strip Use as instructed 100 each 12  . glucose blood test strip Test as directed once daily to check blood sugar. 100 each 2  . glyBURIDE (DIABETA) 5 MG tablet Take 2 tablets (10 mg total) by mouth 2 (two) times daily with a meal. 120 tablet 5  . levothyroxine (SYNTHROID, LEVOTHROID) 100 MCG tablet Take 1 tablet (100 mcg total) by mouth daily. 90 tablet 0  . lisinopril (PRINIVIL,ZESTRIL) 20 MG tablet Take 1 tablet (20 mg total) by mouth daily. 90 tablet 1  . metFORMIN (GLUCOPHAGE) 1000 MG tablet Take 1 tablet (1,000 mg total) by mouth 2 (two) times daily with a meal. 180 tablet 1  . methylphenidate (RITALIN LA) 40 MG 24 hr capsule Take 1 capsule (40 mg total) by mouth daily. 30 capsule 0  . [START ON 08/03/2017] methylphenidate (RITALIN LA) 40 MG 24 hr capsule Take 1 capsule (40 mg total) by mouth every morning. 30 capsule 0  . [START ON 09/02/2017] methylphenidate (RITALIN LA) 40 MG 24 hr capsule Take 1 capsule (40 mg total)  by mouth daily. 30 capsule 0  . naproxen (NAPROSYN) 500 MG tablet Take 1 tablet (500 mg total) by mouth at bedtime. 90 tablet 0  . OVER THE COUNTER MEDICATION Iron-Take 1 tablet by mouth daily.    Marland Kitchen OVER THE COUNTER MEDICATION Calcium-Take 1 tablet by mouth daily.    Marland Kitchen OVER THE COUNTER MEDICATION Magnesium-Take 1 tablet by mouth daily.    . pioglitazone (ACTOS) 30 MG tablet Take 1 tablet (30 mg total) by mouth daily. 90 tablet 1  . prazosin (MINIPRESS) 2 MG capsule Take 1 capsule (2 mg total) by mouth at bedtime. 90 capsule 1  . thiamine 100 MG tablet Take 100 mg by mouth daily.    . traMADol-acetaminophen (ULTRACET) 37.5-325 MG tablet Take 2 tablets by mouth at bedtime. 60 tablet 5    Related testing: Date of retinal exam: 05/2016  Done by:  [facility] Pneumovax: done Flu Shot: Will  get today  Review of Systems: Pulmonary:  No SOB Cardiovascular:  No chest pain  Objective:  BP 124/78 (BP Location: Left Arm, Patient Position: Sitting, Cuff Size: Normal)   Pulse 93   Temp 98.1 F (36.7 C) (Oral)   Wt 160 lb 6.4 oz (72.8 kg)   LMP 06/11/2017   SpO2 98%   BMI 27.53 kg/m  General:  Well developed, well nourished, in no apparent distress Skin:  Warm, no pallor or diaphoresis Head:  Normocephalic, atraumatic Eyes:  Pupils equal and round, sclera anicteric without injection  Nose:  External nares without trauma, no discharge Throat/Pharynx:  Lips and gingiva without lesion Neck: Neck supple.  No obvious thyromegaly or masses.  No bruits Lungs:  clear to auscultation, breath sounds equal bilaterally, no wheezes, rales, or stridor Cardio:  regular rate and rhythm without murmurs, no bruits, no LE edema Psych: Age appropriate judgment and insight  Assessment:   Type 2 diabetes mellitus without complication, without long-term current use of insulin ([place]) - Plan: dapagliflozin propanediol (FARXIGA) 5 MG TABS tablet, Hemoglobin A1c  Need for influenza vaccination - Plan: Flu Vaccine QUAD 6+ mos PF IM (Fluarix Quad PF)   Plan:   Orders as above. Will have pt return for lab visit in 2 weeks when she has GYN visit so it will be >90 days since last check. Add SGLT2 inhibitor, fasting sugars continue to improve. Counseled on diet and exercise. Pt again iterated that she would like to stay away from insulin as along as possible. Flu shot today. Pt instructed to get in contact w eye provider to make appt. Filled out form for dental procedure stating local anesthesia is OK.  F/u in 3 mo. The patient voiced understanding and agreement to the plan.  [place], DO 07/08/17 10:11 AM

## 2017-07-10 ENCOUNTER — Ambulatory Visit: Payer: Self-pay | Admitting: Family Medicine

## 2017-07-11 ENCOUNTER — Telehealth: Payer: Self-pay | Admitting: Family Medicine

## 2017-07-11 NOTE — Telephone Encounter (Signed)
Patient no showed her 07/10/17 appointment at 10:30am, charge or no charge

## 2017-07-11 NOTE — Telephone Encounter (Signed)
[place]. TY.

## 2017-07-16 ENCOUNTER — Ambulatory Visit: Payer: Self-pay | Admitting: Obstetrics & Gynecology

## 2017-07-22 ENCOUNTER — Encounter: Payer: Self-pay | Admitting: Obstetrics & Gynecology

## 2017-07-22 ENCOUNTER — Ambulatory Visit (INDEPENDENT_AMBULATORY_CARE_PROVIDER_SITE_OTHER): Payer: BLUE CROSS/BLUE SHIELD | Admitting: Obstetrics & Gynecology

## 2017-07-22 ENCOUNTER — Other Ambulatory Visit (INDEPENDENT_AMBULATORY_CARE_PROVIDER_SITE_OTHER): Payer: BLUE CROSS/BLUE SHIELD

## 2017-07-22 VITALS — BP 119/68 | HR 96 | Ht 63.0 in | Wt 159.0 lb

## 2017-07-22 DIAGNOSIS — Z1151 Encounter for screening for human papillomavirus (HPV): Secondary | ICD-10-CM | POA: Diagnosis not present

## 2017-07-22 DIAGNOSIS — Z01419 Encounter for gynecological examination (general) (routine) without abnormal findings: Secondary | ICD-10-CM

## 2017-07-22 DIAGNOSIS — Z124 Encounter for screening for malignant neoplasm of cervix: Secondary | ICD-10-CM

## 2017-07-22 DIAGNOSIS — E119 Type 2 diabetes mellitus without complications: Secondary | ICD-10-CM

## 2017-07-22 DIAGNOSIS — Z3202 Encounter for pregnancy test, result negative: Secondary | ICD-10-CM | POA: Diagnosis not present

## 2017-07-22 DIAGNOSIS — N926 Irregular menstruation, unspecified: Secondary | ICD-10-CM

## 2017-07-22 LAB — POCT URINE PREGNANCY: Preg Test, Ur: NEGATIVE

## 2017-07-22 LAB — HEMOGLOBIN A1C: HEMOGLOBIN A1C: 9.8 % — AB (ref 4.6–6.5)

## 2017-07-22 NOTE — Patient Instructions (Signed)
Abnormal Uterine Bleeding Abnormal uterine bleeding can affect women at various stages in life, including teenagers, women in their reproductive years, pregnant women, and women who have reached menopause. Several kinds of uterine bleeding are considered abnormal, including:  Bleeding or spotting between periods.  Bleeding after sexual intercourse.  Bleeding that is heavier or more than normal.  Periods that last longer than usual.  Bleeding after menopause. Many cases of abnormal uterine bleeding are minor and simple to treat, while others are more serious. Any type of abnormal bleeding should be evaluated by your health care provider. Treatment will depend on the cause of the bleeding. Follow these instructions at home: Monitor your condition for any changes. The following actions may help to alleviate any discomfort you are experiencing:  Avoid the use of tampons and douches as directed by your health care provider.  Change your pads frequently. You should get regular pelvic exams and Pap tests. Keep all follow-up appointments for diagnostic tests as directed by your health care provider. Contact a health care provider if:  Your bleeding lasts more than 1 week.  You feel dizzy at times. Get help right away if:  You pass out.  You are changing pads every 15 to 30 minutes.  You have abdominal pain.  You have a fever.  You become sweaty or weak.  You are passing large blood clots from the vagina.  You start to feel nauseous and vomit. This information is not intended to replace advice given to you by your health care provider. Make sure you discuss any questions you have with your health care provider. Document Released: 10/15/2005 Document Revised: 03/28/2016 Document Reviewed: 05/14/2013 Elsevier Interactive Patient Education  2017 [employer]  

## 2017-07-22 NOTE — Progress Notes (Signed)
Subjective:     Stephanie Travis is a 46 y.o. female here for a routine exam.  [id number]   Pt breast fed all of her children.  Current complaints: h/o PCOS that resolved with her pregnancies.  Reports months cycles of 40 days reg. Her last cycle has been very light.  Pt mother was just dx'd with breast cancer. Mother in her early 72's. Pt is from [place].   Gynecologic History Patient's last menstrual period was 07/14/2017. Contraception: tubal ligation 06/2010 Last Pap: 05/2016. Results were: normal Last mammogram: 06/2017. Results were: normal  Obstetric History OB History  Gravida Para Term Preterm AB Living  4 3 3   1 3   SAB TAB Ectopic Multiple Live Births  1            # Outcome Date GA Lbr Len/2nd Weight Sex Delivery Anes PTL Lv  4 Term           3 Term           2 Term           1 SAB               The following portions of the patient's history were reviewed and updated as appropriate: allergies, current medications, past family history, past medical history, past social history, past surgical history and problem list.  Review of Systems Pertinent items are noted in HPI.    Objective:  BP 119/68   Pulse 96   Ht 5\' 3"  (1.6 m)   Wt 159 lb (72.1 kg)   LMP 07/14/2017 Comment: Period has been very light   BMI 28.17 kg/m  General Appearance:    Alert, cooperative, no distress, appears stated age  Head:    Normocephalic, without obvious abnormality, atraumatic  Eyes:    conjunctiva/corneas clear, EOM's intact, both eyes  Ears:    Normal external ear canals, both ears  Nose:   Nares normal, septum midline, mucosa normal, no drainage    or sinus tenderness  Throat:   Lips, mucosa, and tongue normal; teeth and gums normal  Neck:   Supple, symmetrical, trachea midline, no adenopathy;    thyroid:  no enlargement/tenderness/nodules  Back:     Symmetric, no curvature, ROM normal, no CVA tenderness  Lungs:     Clear to auscultation bilaterally, respirations unlabored  Chest  Wall:    No tenderness or deformity   Heart:    Regular rate and rhythm, S1 and S2 normal, no murmur, rub   or gallop  Breast Exam:    No tenderness, masses, or nipple abnormality  Abdomen:     Soft, non-tender, bowel sounds active all four quadrants,    no masses, no organomegaly  Genitalia:    Normal female without lesion, discharge or tenderness     Extremities:   Extremities normal, atraumatic, no cyanosis or edema  Pulses:   2+ and symmetric all extremities  Skin:   Skin color, texture, turgor normal, no rashes or lesions      Assessment:    Healthy female exam.   +FH of breast cancer irreg cycles- pt would like to watch for now      Plan:    Follow up in: 1 year.  f/u sooner prn  F/u PAP with hrHPV Reviewed risk factors for breast ca   Rec f/u in 3 month. Pt to keep a menstrual log   Stephanie Travis L. Harraway-Smith, M.D., Cherlynn June

## 2017-07-22 NOTE — Progress Notes (Signed)
Mother was recently diagnosed with breast cancer. PT is up to date on mammogram.

## 2017-07-24 ENCOUNTER — Encounter (HOSPITAL_COMMUNITY): Payer: Self-pay | Admitting: Psychiatry

## 2017-07-24 LAB — CYTOLOGY - PAP
DIAGNOSIS: NEGATIVE
HPV (WINDOPATH): NOT DETECTED

## 2017-07-26 ENCOUNTER — Other Ambulatory Visit: Payer: Self-pay | Admitting: Family Medicine

## 2017-07-26 DIAGNOSIS — E039 Hypothyroidism, unspecified: Secondary | ICD-10-CM

## 2017-08-07 ENCOUNTER — Encounter: Payer: Self-pay | Admitting: Family Medicine

## 2017-08-14 ENCOUNTER — Encounter (HOSPITAL_COMMUNITY): Payer: Self-pay | Admitting: Psychiatry

## 2017-08-14 ENCOUNTER — Other Ambulatory Visit (HOSPITAL_COMMUNITY): Payer: Self-pay | Admitting: Psychiatry

## 2017-08-14 MED ORDER — BREXPIPRAZOLE 4 MG PO TABS: 4.0000 mg | ORAL_TABLET | Freq: Every day | ORAL | 1 refills | Status: DC

## 2017-08-26 ENCOUNTER — Other Ambulatory Visit: Payer: Self-pay | Admitting: Family Medicine

## 2017-08-26 DIAGNOSIS — G8929 Other chronic pain: Secondary | ICD-10-CM

## 2017-08-26 DIAGNOSIS — M545 Low back pain: Principal | ICD-10-CM

## 2017-08-28 ENCOUNTER — Other Ambulatory Visit: Payer: Self-pay | Admitting: Family Medicine

## 2017-08-28 DIAGNOSIS — E119 Type 2 diabetes mellitus without complications: Secondary | ICD-10-CM

## 2017-08-28 DIAGNOSIS — F319 Bipolar disorder, unspecified: Secondary | ICD-10-CM

## 2017-08-28 MED ORDER — GABAPENTIN 300 MG PO CAPS: 900.0000 mg | ORAL_CAPSULE | Freq: Two times a day (BID) | ORAL | 3 refills | Status: DC

## 2017-09-02 LAB — HM DIABETES EYE EXAM

## 2017-09-03 ENCOUNTER — Other Ambulatory Visit: Payer: Self-pay | Admitting: Family Medicine

## 2017-09-03 DIAGNOSIS — E119 Type 2 diabetes mellitus without complications: Secondary | ICD-10-CM

## 2017-09-12 ENCOUNTER — Encounter (HOSPITAL_COMMUNITY): Payer: Self-pay | Admitting: Psychiatry

## 2017-09-12 ENCOUNTER — Ambulatory Visit (INDEPENDENT_AMBULATORY_CARE_PROVIDER_SITE_OTHER): Payer: BLUE CROSS/BLUE SHIELD | Admitting: Psychiatry

## 2017-09-12 VITALS — BP 112/68 | HR 98 | Ht 62.25 in | Wt 160.0 lb

## 2017-09-12 DIAGNOSIS — Z818 Family history of other mental and behavioral disorders: Secondary | ICD-10-CM

## 2017-09-12 DIAGNOSIS — F341 Dysthymic disorder: Secondary | ICD-10-CM | POA: Diagnosis not present

## 2017-09-12 DIAGNOSIS — R0683 Snoring: Secondary | ICD-10-CM | POA: Diagnosis not present

## 2017-09-12 DIAGNOSIS — G4730 Sleep apnea, unspecified: Secondary | ICD-10-CM | POA: Diagnosis not present

## 2017-09-12 DIAGNOSIS — Z79899 Other long term (current) drug therapy: Secondary | ICD-10-CM

## 2017-09-12 DIAGNOSIS — R51 Headache: Secondary | ICD-10-CM

## 2017-09-12 DIAGNOSIS — Z87891 Personal history of nicotine dependence: Secondary | ICD-10-CM

## 2017-09-12 MED ORDER — CLOMIPRAMINE HCL 25 MG PO CAPS: ORAL_CAPSULE | ORAL | 0 refills | Status: DC

## 2017-09-12 MED ORDER — CLOMIPRAMINE HCL 75 MG PO CAPS: 75.0000 mg | ORAL_CAPSULE | Freq: Every day | ORAL | 1 refills | Status: DC

## 2017-09-12 MED ORDER — DESVENLAFAXINE SUCCINATE ER 50 MG PO TB24: 50.0000 mg | ORAL_TABLET | Freq: Every day | ORAL | 0 refills | Status: DC

## 2017-09-12 NOTE — Progress Notes (Signed)
[place] MD/PA/NP OP Progress Note  09/12/2017 10:49 AM Stephanie Travis  MRN:  [id number]  Chief Complaint:  Chief Complaint    Follow-up    depressed  HPI: Stephanie Travis presents today for med management follow-up.  She continues to feel persistently depressed, fatigued, hopeless.  She has chronic suicidal thoughts, and worries that she will never feel better.  She does not have any plan to take her life because of her faith.  She does not feel the Rexulti has been helpful, so we agreed to discontinue abruptly given the long half-life of the medication.  She feels fatigued and sleeps for 9-10 hours per day without feeling rested.  I spent time reviewing snoring and daytime headaches, and she presents symptoms concerning for sleep apnea.  She was agreeable to polysomnography and titration for CPAP.  I spent time with the patient reviewing her past medication treatments for depression, presumably bipolar depression, including lithium, Abilify, Rexulti, and even ECT.  She reports that she is not sure if she is actually bipolar because she has been told that since she was a teenager.  I reviewed my initial encounter and reiterated some of the points that had come up during that encounter, specifically doubt about whether the bipolar diagnosis is accurate.  She does not present any episodes consistent with hypomania or mania, or psychosis.  She continues to present symptoms most consistent with persistent depressive disorder and PTSD, a clouded by a long lengthy history of alcohol use disorder.  I spent time with the patient discussing a fairly drastic change in her medication regimen.  I proposed that we stop prazosin, discontinue Rexulti as discussed, taper and discontinue Pristiq, and switch her to clomipramine, for a more potent antidepressant effect.  I reviewed the risks of tricyclic antidepressants, and some of the common side effects including gastrointestinal, anticholinergic, sedation, and withdrawal.   Specifically, an added benefit of tricyclic would be that it may help with some of her chronic neurological pain.  She was agreeable to this change, as she feels Pristiq has not provided any consistent benefit, as evidenced by her lack of remission of depressive symptoms for over 2 years.   We agreed to proceed as a titration regimen as discussed and summarized in the plan.  We agreed to continue Ritalin for now, while we await the results of polysomnography.  If she ultimately has sleep apnea, we will proceed with CPAP, and may consider Provigil for wakefulness rather than methylphenidate.  Visit Diagnosis:    ICD-10-CM   1. Persistent depressive disorder F34.1 Split night study  2. Sleep-disordered breathing G47.30 Split night study    Past Psychiatric History: See intake H&P for full details. Reviewed, with no updates at this time.   Past Medical History:  Past Medical History:  Diagnosis Date  . Asthma   . Bipolar 1 disorder ([place]) 12/13/2016  . Depression   . Diabetes mellitus without complication ([place])   . Hypertension   . Hypothyroid 12/13/2016  . Iron (Fe) deficiency anemia   . Pinched thoracic nerve root   . Thyroid disease   . Type 2 diabetes mellitus ([place]) 12/13/2016  . Ulcer     Past Surgical History:  Procedure Laterality Date  . CESAREAN SECTION     x 3  . EYE SURGERY     Laser x 2  . TUBAL LIGATION      Family Psychiatric History: See intake H&P for full details. Reviewed, with no updates at this time.  Family History:  Family History  Problem Relation Age of Onset  . Arthritis Mother   . Ulcers Mother   . Breast cancer Mother   . Depression Father   . Diabetes Father   . Arthritis Father   . Hypertension Father   . Ulcers Sister   . ADD / ADHD Daughter   . Anxiety disorder Daughter   . Stroke Maternal Grandfather   . Alzheimer's disease Paternal Grandmother   . Depression Paternal Grandfather   . Bipolar disorder Paternal Grandfather   . Breast  cancer Maternal Aunt     Social History:  Social History   Socioeconomic History  . Marital status: Married    Spouse name: None  . Number of children: None  . Years of education: None  . Highest education level: None  Social Needs  . Financial resource strain: None  . Food insecurity - worry: None  . Food insecurity - inability: None  . Transportation needs - medical: None  . Transportation needs - non-medical: None  Occupational History  . None  Tobacco Use  . Smoking status: Former Smoker    Last attempt to quit: 12/12/2013    Years since quitting: 3.7  . Smokeless tobacco: Never Used  Substance and Sexual Activity  . Alcohol use: No    Comment: Former alcoholic  . Drug use: No  . Sexual activity: Yes    Birth control/protection: None  Other Topics Concern  . None  Social History Narrative  . None    Allergies: No Known Allergies  Metabolic Disorder Labs: Lab Results  Component Value Date   HGBA1C 9.8 (H) 07/22/2017   No results found for: PROLACTIN Lab Results  Component Value Date   CHOL 184 12/18/2016   TRIG 270.0 (H) 12/18/2016   HDL 52.70 12/18/2016   CHOLHDL 3 12/18/2016   VLDL 54.0 (H) 12/18/2016   Lab Results  Component Value Date   TSH 2.82 05/27/2017    Therapeutic Level Labs: No results found for: LITHIUM No results found for: VALPROATE No components found for:  CBMZ  Current Medications: Current Outpatient Medications  Medication Sig Dispense Refill  . atorvastatin (LIPITOR) 40 MG tablet Take 1 tablet (40 mg total) by mouth daily. 90 tablet 3  . clomiPRAMINE (ANAFRANIL) 25 MG capsule 1 capsule x 7 days, then 2 capsules x 7 days, then switch to the 75 mg capsule 21 capsule 0  . [START ON 09/25/2017] clomiPRAMINE (ANAFRANIL) 75 MG capsule Take 1 capsule (75 mg total) at bedtime by mouth. 90 capsule 1  . desvenlafaxine (PRISTIQ) 50 MG 24 hr tablet Take 1 tablet (50 mg total) daily by mouth. Take 50 mg daily for 1 week then discontinue 7  tablet 0  . esomeprazole (NEXIUM) 40 MG capsule Take 1 capsule (40 mg total) by mouth daily. 90 capsule 1  . FARXIGA 5 MG TABS tablet TAKE 5 MG BY MOUTH DAILY. 30 tablet 3  . Fluticasone-Salmeterol (ADVAIR DISKUS) 250-50 MCG/DOSE AEPB Inhale 1 puff into the lungs 2 (two) times daily. 3 each 1  . gabapentin (NEURONTIN) 300 MG capsule Take 3 capsules (900 mg total) by mouth 2 (two) times daily. 180 capsule 3  . glucose blood (ONETOUCH VERIO) test strip Use as instructed 100 each 12  . glucose blood test strip Test as directed once daily to check blood sugar. 100 each 2  . glyBURIDE (DIABETA) 5 MG tablet Take 2 tablets (10 mg total) by mouth 2 (two) times daily with a  meal. 120 tablet 5  . levothyroxine (SYNTHROID, LEVOTHROID) 100 MCG tablet Take 1 tablet (100 mcg total) by mouth daily. 90 tablet 0  . levothyroxine (SYNTHROID, LEVOTHROID) 100 MCG tablet TAKE 1 TABLET BY MOUTH EVERY DAY 90 tablet 0  . lisinopril (PRINIVIL,ZESTRIL) 20 MG tablet Take 1 tablet (20 mg total) by mouth daily. 90 tablet 1  . metFORMIN (GLUCOPHAGE) 1000 MG tablet Take 1 tablet (1,000 mg total) by mouth 2 (two) times daily with a meal. 180 tablet 1  . methylphenidate (RITALIN LA) 40 MG 24 hr capsule Take 1 capsule (40 mg total) by mouth daily. 30 capsule 0  . methylphenidate (RITALIN LA) 40 MG 24 hr capsule Take 1 capsule (40 mg total) by mouth every morning. 30 capsule 0  . methylphenidate (RITALIN LA) 40 MG 24 hr capsule Take 1 capsule (40 mg total) by mouth daily. 30 capsule 0  . naproxen (NAPROSYN) 500 MG tablet Take 1 tablet (500 mg total) by mouth at bedtime. 90 tablet 0  . naproxen (NAPROSYN) 500 MG tablet TAKE 1 TABLET BY MOUTH AT BEDTIME 90 tablet 0  . OVER THE COUNTER MEDICATION Iron-Take 1 tablet by mouth daily.    Marland Kitchen OVER THE COUNTER MEDICATION Calcium-Take 1 tablet by mouth daily.    Marland Kitchen OVER THE COUNTER MEDICATION Magnesium-Take 1 tablet by mouth daily.    . pioglitazone (ACTOS) 30 MG tablet Take 1 tablet (30 mg  total) by mouth daily. 90 tablet 1  . thiamine 100 MG tablet Take 100 mg by mouth daily.    . traMADol-acetaminophen (ULTRACET) 37.5-325 MG tablet Take 2 tablets by mouth at bedtime. 60 tablet 5   No current facility-administered medications for this visit.      Musculoskeletal: Strength & Muscle Tone: within normal limits Gait & Station: normal Patient leans: N/A  Psychiatric Specialty Exam: ROS  Blood pressure 112/68, pulse 98, height 5' 2.25" (1.581 m), weight 160 lb (72.6 kg), SpO2 95 %.Body mass index is 29.03 kg/m.  General Appearance: Casual and Fairly Groomed  Eye Contact:  Fair  Speech:  Clear and Coherent  Volume:  Normal  Mood:  Anxious, Depressed and Dysphoric  Affect:  Congruent  Thought Process:  Goal Directed and Descriptions of Associations: Intact  Orientation:  Full (Time, Place, and Person)  Thought Content: Logical   Suicidal Thoughts:  Yes.  without intent/plan  Homicidal Thoughts:  No  Memory:  Immediate;   Fair  Judgement:  Fair  Insight:  Shallow  Psychomotor Activity:  Normal  Concentration:  Concentration: Fair  Recall:  [employer] of Knowledge: Fair  Language: Fair  Akathisia:  Negative  Handed:  Right  AIMS (if indicated): done  Assets:  Communication Skills Desire for Improvement Financial Resources/Insurance Intimacy Social Support  ADL's:  Intact  Cognition: WNL  Sleep:  Fair   Screenings: PHQ2-9     Nutrition from 05/14/2017 in [facility] Visit from 04/10/2017 in [employer] at [employer]  PHQ-2 Total Score  6  6  PHQ-9 Total Score  No data  19      Assessment and Plan: Stephanie Travis presents with ongoing persistent depressive disorder, and we spent time today considering pharmacologic interventions vastly different from her prior medication management attempts.  She has largely been treated under the assumption that she has bipolar depression, and at our first  encounter, this was a diagnosis that remained unclear, and possibly inaccurate.  Reiterating this today, the patient  does not present any history of mania or psychosis, and much of her lability was in the context of heavy alcohol use.  She now remains sober from alcohol but struggles with ongoing depressive symptoms.  We agreed to taper and discontinue medications as below, and to titrate clomipramine for severe persistent depressive disorder.  I continue to encourage her to consider ECT that she had a negative experience with this in the past.  We may ultimately consider [place] if we are operating under the assumption that she has major depressive disorder and/or persistent depressive disorder rather than bipolar depression.   I am also very concerned about the possibility of sleep apnea given her snoring and daytime headaches, and have ordered for a split study, which the patient is agreeable to completing.  We will follow-up in 4 weeks.  1. Persistent depressive disorder   2. Sleep-disordered breathing     Status of current problems: unchanged  Labs Ordered: Orders Placed This Encounter  Procedures  . Split night study    Standing Status:   Future    Standing Expiration Date:   09/12/2018    Order Specific Question:   Where should this test be performed:    Answer:   [facility] [place] Reviewed: n/a  Collateral Obtained/Records Reviewed: n/a  Plan:  Discontinue Rexulti given lack of benefit Discontinue prazosin 2 mg, in favor of clomipramine for sleep/mood Pristique 50 mg x 1 week, then discontinue, been lack of benefit Continue Ritalin 40 mg LA, educated patient on the interaction between methylphenidate and to tricyclics Initiate clomipramine 25 mg x 1 week, then 50 mg x 1 week, then 75 mg thereafter Return to clinic in 4 weeks Order for split night polysomnography  I spent 30 minutes with the patient in direct face-to-face clinical care.  Greater than 50% of this  time was spent in counseling and coordination of care with the patient.    Aundra Dubin, MD 09/12/2017, 10:49 AM

## 2017-09-13 DIAGNOSIS — J019 Acute sinusitis, unspecified: Secondary | ICD-10-CM | POA: Diagnosis not present

## 2017-09-24 ENCOUNTER — Encounter (HOSPITAL_COMMUNITY): Payer: Self-pay | Admitting: Psychiatry

## 2017-09-24 ENCOUNTER — Other Ambulatory Visit (HOSPITAL_COMMUNITY): Payer: Self-pay | Admitting: Psychiatry

## 2017-09-24 NOTE — Progress Notes (Unsigned)
Attempted to call patient to discuss her concerns about ongoing depression, but no answer. Will message via mychart.

## 2017-09-25 ENCOUNTER — Telehealth (HOSPITAL_COMMUNITY): Payer: Self-pay | Admitting: Psychiatry

## 2017-09-25 NOTE — Telephone Encounter (Signed)
Stephanie Travis, can you reach out to [facility] to discuss IOP.  She may want to start this week if possible.

## 2017-09-25 NOTE — Telephone Encounter (Signed)
When I spoke with her yesterday she was ready to go.  This is exactly what she said last time. She has very limited skills at coping with anxiety and distress, and very limited insight.  I will see her next week and discuss more with her. Thank you so much for calling her.

## 2017-09-29 MED ORDER — CLOMIPRAMINE HCL 50 MG PO CAPS: 100.0000 mg | ORAL_CAPSULE | Freq: Every day | ORAL | 0 refills | Status: DC

## 2017-10-03 ENCOUNTER — Other Ambulatory Visit (HOSPITAL_COMMUNITY): Payer: Self-pay

## 2017-10-03 MED ORDER — CLOMIPRAMINE HCL 75 MG PO CAPS: 150.0000 mg | ORAL_CAPSULE | Freq: Every day | ORAL | 0 refills | Status: DC

## 2017-10-07 ENCOUNTER — Ambulatory Visit (HOSPITAL_COMMUNITY): Payer: Self-pay | Admitting: Psychiatry

## 2017-10-10 ENCOUNTER — Other Ambulatory Visit (HOSPITAL_COMMUNITY): Payer: Self-pay | Admitting: Psychiatry

## 2017-10-10 ENCOUNTER — Encounter (HOSPITAL_COMMUNITY): Payer: Self-pay | Admitting: Psychiatry

## 2017-10-10 DIAGNOSIS — F319 Bipolar disorder, unspecified: Secondary | ICD-10-CM

## 2017-10-10 DIAGNOSIS — E119 Type 2 diabetes mellitus without complications: Secondary | ICD-10-CM

## 2017-10-10 DIAGNOSIS — F909 Attention-deficit hyperactivity disorder, unspecified type: Secondary | ICD-10-CM

## 2017-10-10 DIAGNOSIS — F341 Dysthymic disorder: Secondary | ICD-10-CM

## 2017-10-10 MED ORDER — METHYLPHENIDATE HCL ER (LA) 40 MG PO CP24: 40.0000 mg | ORAL_CAPSULE | Freq: Every day | ORAL | 0 refills | Status: DC

## 2017-10-10 MED ORDER — METHYLPHENIDATE HCL ER (LA) 40 MG PO CP24: 40.0000 mg | ORAL_CAPSULE | ORAL | 0 refills | Status: DC

## 2017-10-10 MED ORDER — CLOMIPRAMINE HCL 75 MG PO CAPS: 225.0000 mg | ORAL_CAPSULE | Freq: Every day | ORAL | 1 refills | Status: DC

## 2017-10-10 MED ORDER — GABAPENTIN 300 MG PO CAPS: 1200.0000 mg | ORAL_CAPSULE | Freq: Every day | ORAL | 1 refills | Status: DC

## 2017-10-10 NOTE — Progress Notes (Signed)
Spoke with patient over the phone for a medication management check in.  She has not had any serotonergic side effects with the clomipramine.  She reports that her mood has come out of the most significant part of the depression, and she feels roughly about the same as she did 2 months ago.  We spent time discussing that it seems like her depression is a bit stuck, and I spent ample time educating the patient about the value of cognitive behavioral therapy, and the limitations on mood improvement without adequate cognitive behavioral therapy and/or group therapy.  I had referred her to the IOP which she was willing to do, but ultimately decided not to do as her mood improved over the past 2 weeks.  Spent time discussing that it seems were able to get significant mood improvement using substantially less medications, so I recommended that we increase clomipramine to 225 mg nightly.  We can continue the Ritalin and I suggested we decrease the gabapentin to [phone] mg nightly, and discontinue the morning dose.  This can hopefully decrease some of the patient's complaints of daytime sedation.  She denies any suicidal thoughts, denies any substance abuse, and reports that overall she feels like she is trending in a better direction, we will follow-up in 10 weeks as scheduled.  I have also agreed to provide her with a referral for individual therapy.  Clomipramine 225 mg nightly Gabapentin 1200 mg nightly Ritalin LA  40 mg daily; 3 months sent in

## 2017-10-14 ENCOUNTER — Other Ambulatory Visit (HOSPITAL_COMMUNITY): Payer: Self-pay

## 2017-10-14 DIAGNOSIS — F319 Bipolar disorder, unspecified: Secondary | ICD-10-CM

## 2017-10-14 DIAGNOSIS — E119 Type 2 diabetes mellitus without complications: Secondary | ICD-10-CM

## 2017-10-14 MED ORDER — GABAPENTIN 300 MG PO CAPS: 1200.0000 mg | ORAL_CAPSULE | Freq: Every day | ORAL | 0 refills | Status: DC

## 2017-10-22 ENCOUNTER — Other Ambulatory Visit: Payer: Self-pay | Admitting: Family Medicine

## 2017-10-22 DIAGNOSIS — E039 Hypothyroidism, unspecified: Secondary | ICD-10-CM

## 2017-10-23 ENCOUNTER — Encounter: Payer: Self-pay | Admitting: Family Medicine

## 2017-10-23 ENCOUNTER — Ambulatory Visit: Payer: BLUE CROSS/BLUE SHIELD | Admitting: Family Medicine

## 2017-10-23 VITALS — BP 110/70 | HR 103 | Temp 98.0°F | Ht 64.0 in | Wt 163.5 lb

## 2017-10-23 DIAGNOSIS — E119 Type 2 diabetes mellitus without complications: Secondary | ICD-10-CM

## 2017-10-23 DIAGNOSIS — J4540 Moderate persistent asthma, uncomplicated: Secondary | ICD-10-CM

## 2017-10-23 LAB — HEMOGLOBIN A1C: Hgb A1c MFr Bld: 7.8 % — ABNORMAL HIGH (ref 4.6–6.5)

## 2017-10-23 MED ORDER — METFORMIN HCL 1000 MG PO TABS: 1000.0000 mg | ORAL_TABLET | Freq: Two times a day (BID) | ORAL | 1 refills | Status: DC

## 2017-10-23 MED ORDER — FLUTICASONE-SALMETEROL 250-50 MCG/DOSE IN AEPB: 1.0000 | INHALATION_SPRAY | Freq: Two times a day (BID) | RESPIRATORY_TRACT | 3 refills | Status: DC

## 2017-10-23 MED ORDER — DAPAGLIFLOZIN PROPANEDIOL 10 MG PO TABS: 10.0000 mg | ORAL_TABLET | Freq: Every day | ORAL | 5 refills | Status: DC

## 2017-10-23 MED ORDER — GLYBURIDE 5 MG PO TABS: 10.0000 mg | ORAL_TABLET | Freq: Two times a day (BID) | ORAL | 11 refills | Status: DC

## 2017-10-23 MED ORDER — LISINOPRIL 20 MG PO TABS: 20.0000 mg | ORAL_TABLET | Freq: Every day | ORAL | 1 refills | Status: DC

## 2017-10-23 MED ORDER — PIOGLITAZONE HCL 30 MG PO TABS: 30.0000 mg | ORAL_TABLET | Freq: Every day | ORAL | 1 refills | Status: DC

## 2017-10-23 NOTE — Progress Notes (Signed)
Pre visit review using our clinic review tool, if applicable. No additional management support is needed unless otherwise documented below in the visit note. 

## 2017-10-23 NOTE — Patient Instructions (Addendum)
Biotene mouthwash can be helpful with dry mouth.  Keep pushing fluids.  Keep walking.  Keep the diet as clean as possible.  Take 2 tabs of the Farxiga 5 mg tabs until you run out.  Come to your next appointment fasting.

## 2017-10-23 NOTE — Progress Notes (Signed)
Subjective:   Chief Complaint  Patient presents with  . Diabetes    Stephanie Travis is a 46 y.o. female here for follow-up of diabetes.   Stephanie Travis's self monitored glucose range is  Patient denies hypoglycemic reactions. She has not been routinely checking her sugars. Patient does not require insulin.   Medications include: Metformin 1000 mg bid, Glyburide 10 mg BID, Actos 30 mg/d, Farxiga 5 mg/d Patient exercises 5 days per week on average.   She does take an aspirin daily. Statin? Yes ACEi/ARB? Yes   Past Medical History:  Diagnosis Date  . Asthma   . Bipolar 1 disorder ([place]) 12/13/2016  . Depression   . Diabetes mellitus without complication ([place])   . Hypertension   . Hypothyroid 12/13/2016  . Iron (Fe) deficiency anemia   . Pinched thoracic nerve root   . Thyroid disease   . Type 2 diabetes mellitus ([place]) 12/13/2016  . Ulcer     Related testing: Foot exam(monofilament and inspection): due 3/17 Date of retinal exam: 08/2017 Pneumovax: not done Flu Shot: not done  Review of Systems: Eye:  No recent significant change in vision Pulmonary:  No SOB Cardiovascular:  No chest pain, no palpitations Skin/Integumentary ROS:  No abnormal skin lesions reported Neurologic:  No numbness, tingling  Objective:  BP 110/70 (BP Location: Left Arm, Patient Position: Sitting, Cuff Size: Normal)   Pulse (!) 103   Temp 98 F (36.7 C) (Oral)   Ht 5\' 4"  (1.626 m)   Wt 163 lb 8 oz (74.2 kg)   SpO2 98%   BMI 28.06 kg/m  General:  Well developed, well nourished, in no apparent distress Throat/Pharynx:  Lips and gingiva without lesion Neck: Neck supple.  No obvious thyromegaly or masses.  No bruits Lungs:  clear to auscultation, breath sounds equal bilaterally, no wheezes, rales, or stridor Cardio:  regular rate and rhythm without murmurs, no bruits, no LE edema Psych: Age appropriate judgment and insight  Assessment:   Type 2 diabetes mellitus without complication, without long-term  current use of insulin (HCC) - Plan: Hemoglobin A1c, lisinopril (PRINIVIL,ZESTRIL) 20 MG tablet, pioglitazone (ACTOS) 30 MG tablet, glyBURIDE (DIABETA) 5 MG tablet  Moderate persistent asthma without complication - Plan: Fluticasone-Salmeterol (ADVAIR DISKUS) 250-50 MCG/DOSE AEPB   Plan:   Orders as above. Increase dose of Farxiga from 5 mg/d to 10 mg/d. Counseled on diet and exercise. Discussed insulin. Pt does not wish to, but is open to it if needed. F/u in 3 mo. The patient voiced understanding and agreement to the plan.  [place], DO 10/23/17 11:50 AM

## 2017-11-01 ENCOUNTER — Ambulatory Visit (HOSPITAL_BASED_OUTPATIENT_CLINIC_OR_DEPARTMENT_OTHER): Payer: BLUE CROSS/BLUE SHIELD | Attending: Psychiatry | Admitting: Internal Medicine

## 2017-11-01 VITALS — Ht 64.0 in | Wt 160.0 lb

## 2017-11-01 DIAGNOSIS — G4733 Obstructive sleep apnea (adult) (pediatric): Secondary | ICD-10-CM | POA: Insufficient documentation

## 2017-11-01 DIAGNOSIS — G4730 Sleep apnea, unspecified: Secondary | ICD-10-CM

## 2017-11-01 DIAGNOSIS — R0989 Other specified symptoms and signs involving the circulatory and respiratory systems: Secondary | ICD-10-CM | POA: Diagnosis not present

## 2017-11-01 DIAGNOSIS — I10 Essential (primary) hypertension: Secondary | ICD-10-CM | POA: Insufficient documentation

## 2017-11-01 DIAGNOSIS — F341 Dysthymic disorder: Secondary | ICD-10-CM | POA: Diagnosis not present

## 2017-11-04 ENCOUNTER — Other Ambulatory Visit: Payer: Self-pay | Admitting: Family Medicine

## 2017-11-04 DIAGNOSIS — E119 Type 2 diabetes mellitus without complications: Secondary | ICD-10-CM

## 2017-11-04 MED ORDER — ATORVASTATIN CALCIUM 40 MG PO TABS: 40.0000 mg | ORAL_TABLET | Freq: Every day | ORAL | 3 refills | Status: DC

## 2017-11-08 ENCOUNTER — Encounter: Payer: Self-pay | Admitting: Family Medicine

## 2017-11-08 ENCOUNTER — Ambulatory Visit: Payer: BLUE CROSS/BLUE SHIELD | Admitting: Family Medicine

## 2017-11-08 VITALS — BP 120/84 | HR 90 | Temp 97.7°F | Ht 64.0 in | Wt 157.4 lb

## 2017-11-08 DIAGNOSIS — H6501 Acute serous otitis media, right ear: Secondary | ICD-10-CM | POA: Diagnosis not present

## 2017-11-08 MED ORDER — AMOXICILLIN 875 MG PO TABS: 875.0000 mg | ORAL_TABLET | Freq: Two times a day (BID) | ORAL | 0 refills | Status: DC

## 2017-11-08 MED ORDER — PREDNISONE 20 MG PO TABS: 40.0000 mg | ORAL_TABLET | Freq: Every day | ORAL | 0 refills | Status: AC

## 2017-11-08 NOTE — Patient Instructions (Addendum)
Do not take amoxicillin for the next 2 days. See if the steroid helps. If you develop a fever or worsening pain, go ahead and take.  Continue to push fluids, practice good hand hygiene, and cover your mouth if you cough.  If you have drainage from your ear, seek care.  Let us know if you need anything.

## 2017-11-08 NOTE — Progress Notes (Signed)
Chief Complaint  Patient presents with  . Ear Pain    chest/sinus congestion    Stephanie Travis here for URI complaints.  Duration: 1 week  Associated symptoms: sinus congestion, rhinorrhea, ear pain on R, wheezing, chest tightness and productive cough Denies: sinus pain, itchy watery eyes, ear drainage, sore throat, shortness of breath, myalgia and fevers/rigors Treatment to date: none Sick contacts: Yes kids sick  ROS:  Const: Denies fevers HEENT: As noted in HPI Lungs: No SOB  Past Medical History:  Diagnosis Date  . Asthma   . Bipolar 1 disorder ([place]) 12/13/2016  . Depression   . Diabetes mellitus without complication ([place])   . Hypertension   . Hypothyroid 12/13/2016  . Iron (Fe) deficiency anemia   . Pinched thoracic nerve root   . Thyroid disease   . Type 2 diabetes mellitus ([place]) 12/13/2016  . Ulcer    Family History  Problem Relation Age of Onset  . Arthritis Mother   . Ulcers Mother   . Breast cancer Mother   . Depression Father   . Diabetes Father   . Arthritis Father   . Hypertension Father   . Ulcers Sister   . ADD / ADHD Daughter   . Anxiety disorder Daughter   . Stroke Maternal Grandfather   . Alzheimer's disease Paternal Grandmother   . Depression Paternal Grandfather   . Bipolar disorder Paternal Grandfather   . Breast cancer Maternal Aunt     BP 120/84 (BP Location: Left Arm, Patient Position: Sitting, Cuff Size: Normal)   Pulse 90   Temp 97.7 F (36.5 C) (Oral)   Ht 5\' 4"  (1.626 m)   Wt 157 lb 6 oz (71.4 kg)   SpO2 97%   BMI 27.01 kg/m  General: Awake, alert, appears stated age HEENT: AT, [place], ears patent b/l and TM neg on L, serous fluid, erythematous and slight bulge on R, nares patent w/o discharge, pharynx pink and without exudates, MMM Neck: No masses or asymmetry Heart: RRR Lungs: CTAB, no accessory muscle use Psych: Age appropriate judgment and insight, normal mood and affect  Right acute serous otitis media, recurrence not  specified - Plan: amoxicillin (AMOXIL) 875 MG tablet, predniSONE (DELTASONE) 20 MG tablet  Orders as above. Could be viral, take steroid, abx in 2 days if no better. Other issues likely related to viral uri that predisposed to ear infection. Continue to push fluids, practice good hand hygiene, cover mouth when coughing. F/u prn. If starting to experience fevers, shaking, or shortness of breath, seek immediate care. Pt voiced understanding and agreement to the plan.  [place], DO 11/08/17 11:37 AM

## 2017-11-08 NOTE — Progress Notes (Signed)
Pre visit review using our clinic review tool, if applicable. No additional management support is needed unless otherwise documented below in the visit note. 

## 2017-11-10 DIAGNOSIS — F341 Dysthymic disorder: Secondary | ICD-10-CM

## 2017-11-10 NOTE — Procedures (Signed)
    Patient Name: Stephanie Travis, Stephanie Travis Date: 11/01/2017 Gender: Female D.O.B: 19-Mar-1971 Age (years): 46 Referring Provider: Lulu Riding Height (inches): 95 Interpreting Physician: Baird Lyons MD, ABSM Weight (lbs): 160 RPSGT: Baxter Flattery BMI: 27 MRN: [id number] Neck Size: 15.50 <br> <br> CLINICAL INFORMATION Sleep Study Type: NPSG  Indication for sleep study: Hypertension, OSA, Snoring, Witnessed Apneas  Epworth Sleepiness Score: 9  SLEEP STUDY TECHNIQUE As per the AASM Manual for the Scoring of Sleep and Associated Events v2.3 (April 2016) with a hypopnea requiring 4% desaturations.  The channels recorded and monitored were frontal, central and occipital EEG, electrooculogram (EOG), submentalis EMG (chin), nasal and oral airflow, thoracic and abdominal wall motion, anterior tibialis EMG, snore microphone, electrocardiogram, and pulse oximetry.  MEDICATIONS Medications self-administered by patient taken the night of the study : none reported  SLEEP ARCHITECTURE The study was initiated at 10:42:31 PM and ended at 4:52:39 AM.  Sleep onset time was 10.9 minutes and the sleep efficiency was 80.0%. The total sleep time was 296.3 minutes.  Stage REM latency was 244.5 minutes.  The patient spent 6.24% of the night in stage N1 sleep, 76.88% in stage N2 sleep, 0.00% in stage N3 and 16.88% in REM.  Alpha intrusion was absent.  Supine sleep was 63.04%.  RESPIRATORY PARAMETERS The overall apnea/hypopnea index (AHI) was 13.2 per hour. There were 1 total apneas, including 1 obstructive, 0 central and 0 mixed apneas. There were 64 hypopneas and 0 RERAs.  The AHI during Stage REM sleep was 18.0 per hour.  AHI while supine was 19.3 per hour.  The mean oxygen saturation was 94.85%. The minimum SpO2 during sleep was 84.00%.  loud snoring was noted during this study.  CARDIAC DATA The 2 lead EKG demonstrated sinus rhythm. The mean heart rate was 91.31 beats per minute.  Other EKG findings include: None.  LEG MOVEMENT DATA The total PLMS were 115 with a resulting PLMS index of 23.29. Associated arousal with leg movement index was 0.2 .  IMPRESSIONS - Mild obstructive sleep apnea occurred during this study (AHI = 13.2/h). - Insufficient early events to meet protocol requirments for split CPAP titration. - No significant central sleep apnea occurred during this study (CAI = 0.0/h). - Mild oxygen desaturation was noted during this study (Min O2 = 84.00%, Mean 94.8%). - The patient snored with loud snoring volume. - No cardiac abnormalities were noted during this study. - Mild periodic limb movements of sleep occurred during the study. No significant associated arousals. - Patient had difficulty initiating and maintaining sleep, with frquent wakings.  DIAGNOSIS - Obstructive Sleep Apnea (327.23 [G47.33 ICD-10])  RECOMMENDATIONS - Suggest CPAP titration, AutoPAP, or a fitted oral appliance to treat OSA - Positional therapy avoiding supine position during sleep. - Be careful with alcohol, sedatives and other CNS depressants that may worsen sleep apnea and disrupt normal sleep architecture. - Sleep hygiene should be reviewed to assess factors that may improve sleep quality. - Weight management and regular exercise should be initiated or continued if appropriate.  [Electronically signed] 11/10/2017 10:07 AM  Baird Lyons MD, ABSM Diplomate, American Board of Sleep Medicine   NPI: [id number]                        [facility]  ELECTRONICALLY SIGNED ON:  11/10/2017, 10:08 AM [place] PH: [phone]   FX: [phone] [place]

## 2017-11-30 ENCOUNTER — Other Ambulatory Visit: Payer: Self-pay | Admitting: Family Medicine

## 2017-11-30 DIAGNOSIS — M545 Low back pain: Principal | ICD-10-CM

## 2017-11-30 DIAGNOSIS — G8929 Other chronic pain: Secondary | ICD-10-CM

## 2017-12-02 ENCOUNTER — Encounter: Payer: Self-pay | Admitting: Family Medicine

## 2017-12-16 ENCOUNTER — Encounter (HOSPITAL_COMMUNITY): Payer: Self-pay | Admitting: Psychiatry

## 2017-12-17 ENCOUNTER — Other Ambulatory Visit (HOSPITAL_COMMUNITY): Payer: Self-pay | Admitting: Psychiatry

## 2017-12-17 DIAGNOSIS — F909 Attention-deficit hyperactivity disorder, unspecified type: Secondary | ICD-10-CM

## 2017-12-17 MED ORDER — METHYLPHENIDATE HCL ER (LA) 40 MG PO CP24: 40.0000 mg | ORAL_CAPSULE | Freq: Every day | ORAL | 0 refills | Status: DC

## 2017-12-22 ENCOUNTER — Encounter: Payer: Self-pay | Admitting: Family Medicine

## 2017-12-23 ENCOUNTER — Other Ambulatory Visit: Payer: Self-pay | Admitting: Family Medicine

## 2017-12-23 MED ORDER — ESOMEPRAZOLE MAGNESIUM 40 MG PO CPDR: 40.0000 mg | DELAYED_RELEASE_CAPSULE | Freq: Every day | ORAL | 1 refills | Status: DC

## 2017-12-23 MED ORDER — DAPAGLIFLOZIN PROPANEDIOL 10 MG PO TABS: 10.0000 mg | ORAL_TABLET | Freq: Every day | ORAL | 0 refills | Status: DC

## 2017-12-31 ENCOUNTER — Ambulatory Visit (HOSPITAL_COMMUNITY): Payer: Self-pay | Admitting: Psychiatry

## 2017-12-31 ENCOUNTER — Ambulatory Visit (HOSPITAL_COMMUNITY): Payer: BLUE CROSS/BLUE SHIELD | Admitting: Psychiatry

## 2017-12-31 ENCOUNTER — Encounter (HOSPITAL_COMMUNITY): Payer: Self-pay | Admitting: Psychiatry

## 2017-12-31 VITALS — BP 132/74 | HR 105 | Ht 63.5 in | Wt 161.0 lb

## 2017-12-31 DIAGNOSIS — G4730 Sleep apnea, unspecified: Secondary | ICD-10-CM

## 2017-12-31 DIAGNOSIS — G250 Essential tremor: Secondary | ICD-10-CM

## 2017-12-31 DIAGNOSIS — Z810 Family history of intellectual disabilities: Secondary | ICD-10-CM | POA: Diagnosis not present

## 2017-12-31 DIAGNOSIS — E119 Type 2 diabetes mellitus without complications: Secondary | ICD-10-CM | POA: Diagnosis not present

## 2017-12-31 DIAGNOSIS — F909 Attention-deficit hyperactivity disorder, unspecified type: Secondary | ICD-10-CM

## 2017-12-31 DIAGNOSIS — Z818 Family history of other mental and behavioral disorders: Secondary | ICD-10-CM

## 2017-12-31 DIAGNOSIS — F1011 Alcohol abuse, in remission: Secondary | ICD-10-CM | POA: Diagnosis not present

## 2017-12-31 DIAGNOSIS — Z87891 Personal history of nicotine dependence: Secondary | ICD-10-CM

## 2017-12-31 DIAGNOSIS — F341 Dysthymic disorder: Secondary | ICD-10-CM | POA: Diagnosis not present

## 2017-12-31 MED ORDER — METHYLPHENIDATE HCL ER (LA) 40 MG PO CP24: 40.0000 mg | ORAL_CAPSULE | Freq: Every day | ORAL | 0 refills | Status: DC

## 2017-12-31 MED ORDER — GABAPENTIN 300 MG PO CAPS: 1200.0000 mg | ORAL_CAPSULE | Freq: Every day | ORAL | 0 refills | Status: DC

## 2017-12-31 MED ORDER — PROPRANOLOL HCL ER 60 MG PO CP24: 60.0000 mg | ORAL_CAPSULE | Freq: Every day | ORAL | 1 refills | Status: DC

## 2017-12-31 NOTE — Progress Notes (Signed)
BH MD/PA/NP OP Progress Note  12/31/2017 12:33 PM Stephanie Travis  MRN:  [id number]  Chief Complaint: Med management, tremors HPI: Patient reports that she is sleeping well with clomipramine, and has gotten good feedback from her husband about her mood, anxiety.  She complains about a tremor that is mostly associated with action.  She does not have a resting tremor.  No other abnormal movements in her legs or arms.  Predominantly upper extremity hands.  She denies any suicidality or acute safety issues.  She continues on clomipramine 225 mg nightly and gabapentin 1200 mg nightly, taking Ritalin LA in the morning.  We discussed adding propranolol long-acting for benign essential tremor or action tremor.  She was agreeable to this and we discussed the risks of hypotension and low pulse.  Hopeful that this will also help with some of her ongoing social anxiety.  Visit Diagnosis:    ICD-10-CM   1. Benign essential tremor G25.0 propranolol ER (INDERAL LA) 60 MG 24 hr capsule  2. Type 2 diabetes mellitus without complication, without long-term current use of insulin (HCC) E11.9 gabapentin (NEURONTIN) 300 MG capsule  3. Attention deficit hyperactivity disorder (ADHD), unspecified ADHD type F90.9 propranolol ER (INDERAL LA) 60 MG 24 hr capsule    methylphenidate (RITALIN LA) 40 MG 24 hr capsule  4. Persistent depressive disorder F34.1   5. Sleep-disordered breathing G47.30     Past Psychiatric History: See intake H&P for full details. Reviewed, with no updates at this time.   Past Medical History:  Past Medical History:  Diagnosis Date  . Asthma   . Bipolar 1 disorder ([place]) 12/13/2016  . Depression   . Diabetes mellitus without complication ([place])   . Hypertension   . Hypothyroid 12/13/2016  . Iron (Fe) deficiency anemia   . Pinched thoracic nerve root   . Thyroid disease   . Type 2 diabetes mellitus ([place]) 12/13/2016  . Ulcer     Past Surgical History:  Procedure Laterality Date  . CESAREAN  SECTION     x 3  . EYE SURGERY     Laser x 2  . TUBAL LIGATION      Family Psychiatric History: See intake H&P for full details. Reviewed, with no updates at this time.   Family History:  Family History  Problem Relation Age of Onset  . Arthritis Mother   . Ulcers Mother   . Breast cancer Mother   . Depression Father   . Diabetes Father   . Arthritis Father   . Hypertension Father   . Ulcers Sister   . ADD / ADHD Daughter   . Anxiety disorder Daughter   . Stroke Maternal Grandfather   . Alzheimer's disease Paternal Grandmother   . Depression Paternal Grandfather   . Bipolar disorder Paternal Grandfather   . Breast cancer Maternal Aunt     Social History:  Social History   Socioeconomic History  . Marital status: Married    Spouse name: None  . Number of children: None  . Years of education: None  . Highest education level: None  Social Needs  . Financial resource strain: None  . Food insecurity - worry: None  . Food insecurity - inability: None  . Transportation needs - medical: None  . Transportation needs - non-medical: None  Occupational History  . None  Tobacco Use  . Smoking status: Former Smoker    Last attempt to quit: 12/12/2013    Years since quitting: 4.0  . Smokeless tobacco: Never  Used  Substance and Sexual Activity  . Alcohol use: No    Comment: Former alcoholic  . Drug use: No  . Sexual activity: Yes    Birth control/protection: None  Other Topics Concern  . None  Social History Narrative  . None    Allergies: No Known Allergies  Metabolic Disorder Labs: Lab Results  Component Value Date   HGBA1C 7.8 (H) 10/23/2017   No results found for: PROLACTIN Lab Results  Component Value Date   CHOL 184 12/18/2016   TRIG 270.0 (H) 12/18/2016   HDL 52.70 12/18/2016   CHOLHDL 3 12/18/2016   VLDL 54.0 (H) 12/18/2016   Lab Results  Component Value Date   TSH 2.82 05/27/2017    Therapeutic Level Labs: No results found for: LITHIUM No  results found for: VALPROATE No components found for:  CBMZ  Current Medications: Current Outpatient Medications  Medication Sig Dispense Refill  . atorvastatin (LIPITOR) 40 MG tablet Take 1 tablet (40 mg total) by mouth daily. 90 tablet 3  . clomiPRAMINE (ANAFRANIL) 75 MG capsule Take 3 capsules (225 mg total) by mouth at bedtime. 270 capsule 1  . dapagliflozin propanediol (FARXIGA) 10 MG TABS tablet Take 10 mg by mouth daily. 90 tablet 0  . esomeprazole (NEXIUM) 40 MG capsule Take 1 capsule (40 mg total) by mouth daily. 90 capsule 1  . Fluticasone-Salmeterol (ADVAIR DISKUS) 250-50 MCG/DOSE AEPB Inhale 1 puff into the lungs 2 (two) times daily. 3 each 3  . gabapentin (NEURONTIN) 300 MG capsule Take 4 capsules (1,200 mg total) by mouth at bedtime. 360 capsule 0  . glucose blood (ONETOUCH VERIO) test strip Use as instructed 100 each 12  . glucose blood test strip Test as directed once daily to check blood sugar. 100 each 2  . glyBURIDE (DIABETA) 5 MG tablet Take 2 tablets (10 mg total) by mouth 2 (two) times daily with a meal. 120 tablet 11  . levothyroxine (SYNTHROID, LEVOTHROID) 100 MCG tablet TAKE 1 TABLET BY MOUTH EVERY DAY 90 tablet 0  . lisinopril (PRINIVIL,ZESTRIL) 20 MG tablet Take 1 tablet (20 mg total) by mouth daily. 90 tablet 1  . metFORMIN (GLUCOPHAGE) 1000 MG tablet Take 1 tablet (1,000 mg total) by mouth 2 (two) times daily with a meal. 180 tablet 1  . methylphenidate (RITALIN LA) 40 MG 24 hr capsule Take 1 capsule (40 mg total) by mouth daily. 30 capsule 0  . naproxen (NAPROSYN) 500 MG tablet Take 1 tablet (500 mg total) by mouth at bedtime. 90 tablet 0  . OVER THE COUNTER MEDICATION Iron-Take 1 tablet by mouth daily.    Marland Kitchen OVER THE COUNTER MEDICATION Calcium-Take 1 tablet by mouth daily.    Marland Kitchen OVER THE COUNTER MEDICATION Magnesium-Take 1 tablet by mouth daily.    . pioglitazone (ACTOS) 30 MG tablet Take 1 tablet (30 mg total) by mouth daily. 90 tablet 1  . thiamine 100 MG tablet  Take 100 mg by mouth daily.    . propranolol ER (INDERAL LA) 60 MG 24 hr capsule Take 1 capsule (60 mg total) by mouth daily. 90 capsule 1  . traMADol-acetaminophen (ULTRACET) 37.5-325 MG tablet Take 2 tablets by mouth at bedtime. (Patient not taking: Reported on 12/31/2017) 60 tablet 5   No current facility-administered medications for this visit.      Musculoskeletal: Strength & Muscle Tone: within normal limits Gait & Station: normal Patient leans: N/A  Psychiatric Specialty Exam: ROS  Blood pressure 132/74, pulse (!) 105, height 5'  3.5" (1.613 m), weight 161 lb (73 kg).Body mass index is 28.07 kg/m.  General Appearance: Casual and Well Groomed  Eye Contact:  Good  Speech:  Clear and Coherent and Normal Rate  Volume:  Normal  Mood:  Okay I guess  Affect:  Congruent and She appears to be more [place]  Thought Process:  Goal Directed and Descriptions of Associations: Intact  Orientation:  Full (Time, Place, and Person)  Thought Content: Logical   Suicidal Thoughts:  No  Homicidal Thoughts:  No  Memory:  Immediate;   Fair  Judgement:  Fair  Insight:  Shallow  Psychomotor Activity:  Tremor  Concentration:  Concentration: Good  Recall:  Good  Fund of Knowledge: Good  Language: Good  Akathisia:  Negative  Handed:  Right  AIMS (if indicated): not done  Assets:  Communication Skills Desire for Improvement Financial Resources/Insurance Housing Social Support  ADL's:  Intact  Cognition: WNL  Sleep:  Good   Screenings: PHQ2-9     Nutrition from 05/14/2017 in [facility] Visit from 04/10/2017 in [facility] at [facility]  PHQ-2 Total Score  6  6  PHQ-9 Total Score  No data  19       Assessment and Plan:  Stephanie Travis presents for med management follow-up for persistent depressive disorder, ADHD, alcohol use disorder in remission.  She continues to struggle with some social anxiety, and shares an increase in  action tremor with clomipramine.  Clomipramine does appear to have been quite helpful for her in terms of mood, and has addressed her prior state of emotional crisis.  She is sleeping consistently well with clomipramine, remains sober from alcohol, and has received good feedback from her husband.  No acute safety issues.  We have agreed to initiate propranolol for the tremor and social anxiety in conjunction with continued medication regimen below.  We discussed discontinuing Ritalin given that this can contribute to resting tremor, but she feels that it adds to much value for her energy and mood during the day.  1. Benign essential tremor   2. Type 2 diabetes mellitus without complication, without long-term current use of insulin ([place])   3. Attention deficit hyperactivity disorder (ADHD), unspecified ADHD type   4. Persistent depressive disorder   5. Sleep-disordered breathing     Status of current problems: stable  Labs Ordered: No orders of the defined types were placed in this encounter.   Labs Reviewed: na  Collateral Obtained/Records Reviewed: na  Plan:  Continue clomipramine, gabapentin, Ritalin LA Initiate propranolol 60 mg LA Return to clinic in 6 weeks  I spent 20 minutes with the patient in direct face-to-face clinical care.  Greater than 50% of this time was spent in counseling and coordination of care with the patient.    Aundra Dubin, MD 12/31/2017, 12:33 PM

## 2018-01-22 ENCOUNTER — Encounter: Payer: Self-pay | Admitting: Family Medicine

## 2018-01-22 ENCOUNTER — Ambulatory Visit: Payer: BLUE CROSS/BLUE SHIELD | Admitting: Family Medicine

## 2018-01-22 ENCOUNTER — Other Ambulatory Visit: Payer: Self-pay | Admitting: Family Medicine

## 2018-01-22 VITALS — BP 122/82 | HR 85 | Temp 98.4°F | Ht 64.0 in | Wt 159.2 lb

## 2018-01-22 DIAGNOSIS — E119 Type 2 diabetes mellitus without complications: Secondary | ICD-10-CM | POA: Diagnosis not present

## 2018-01-22 LAB — HEMOGLOBIN A1C: Hgb A1c MFr Bld: 7.8 % — ABNORMAL HIGH (ref 4.6–6.5)

## 2018-01-22 MED ORDER — PIOGLITAZONE HCL 45 MG PO TABS: 45.0000 mg | ORAL_TABLET | Freq: Every day | ORAL | 4 refills | Status: DC

## 2018-01-22 NOTE — Progress Notes (Signed)
Subjective:   Chief Complaint  Patient presents with  . Follow-up    Stephanie Travis is a 47 y.o. female here for follow-up of diabetes.   Stephanie Travis has not been checking her sugars. Patient does not require insulin.   Medications include: Glyburide 10 mg twice daily, metformin 1000 mg twice daily, Actos 30 mg daily, Farxiga 10 mg daily-this was increased from 5 mg daily at her last visit; her last A1c was 7.8 Patient exercises 7 days per week on average.   Statin? Yes ACEi/ARB? Yes  Past Medical History:  Diagnosis Date  . Asthma   . Bipolar 1 disorder ([place]) 12/13/2016  . Depression   . Diabetes mellitus without complication ([place])   . Hypertension   . Hypothyroid 12/13/2016  . Iron (Fe) deficiency anemia   . Pinched thoracic nerve root   . Thyroid disease   . Type 2 diabetes mellitus ([place]) 12/13/2016  . Ulcer    Review of Systems: Pulmonary:  No SOB Cardiovascular:  No chest pain, no palpitations  Objective:  BP 122/82 (BP Location: Left Arm, Patient Position: Sitting, Cuff Size: Normal)   Pulse 85   Temp 98.4 F (36.9 C) (Oral)   Ht 5\' 4"  (1.626 m)   Wt 159 lb 4 oz (72.2 kg)   SpO2 97%   BMI 27.34 kg/m  General:  Well developed, well nourished, in no apparent distress Skin:  Warm, no pallor or diaphoresis Head:  Normocephalic, atraumatic Eyes:  Pupils equal and round, sclera anicteric without injection  Nose:  External nares without trauma, no discharge Throat/Pharynx:  Lips and gingiva without lesion Neck: Neck supple.  No obvious thyromegaly or masses.  No bruits Lungs:  clear to auscultation, breath sounds equal bilaterally, no wheezes, rales, or stridor Cardio:  regular rate and rhythm without murmurs, no bruits, no LE edema Neuro:  Sensation intact to pinprick on feet Psych: Age appropriate judgment and insight  Assessment:   Type 2 diabetes mellitus without complication, without long-term current use of insulin ([place]) - Plan: Hemoglobin A1c, HM Diabetes Foot  Exam   Plan:   Orders as above. Ck A1c, if still elevated will increase Actos to 45 mg daily.  Acarbose is another non-insulin option she really does not want to do that. Counseled on diet and exercise, finances are a barrier. F/u in 3 mo. The patient voiced understanding and agreement to the plan.  [place], DO 01/22/18 12:13 PM

## 2018-01-22 NOTE — Progress Notes (Signed)
Pre visit review using our clinic review tool, if applicable. No additional management support is needed unless otherwise documented below in the visit note. 

## 2018-01-22 NOTE — Patient Instructions (Signed)
Keep the diet as clean as possible.  If we make a change, we will increase the Actos, but I will let you know.  Let us know if you need anything.

## 2018-02-03 ENCOUNTER — Other Ambulatory Visit: Payer: Self-pay | Admitting: Family Medicine

## 2018-02-03 DIAGNOSIS — E119 Type 2 diabetes mellitus without complications: Secondary | ICD-10-CM

## 2018-02-03 MED ORDER — GLYBURIDE 5 MG PO TABS: 10.0000 mg | ORAL_TABLET | Freq: Two times a day (BID) | ORAL | 0 refills | Status: DC

## 2018-02-09 ENCOUNTER — Encounter (HOSPITAL_COMMUNITY): Payer: Self-pay | Admitting: Psychiatry

## 2018-02-09 DIAGNOSIS — F909 Attention-deficit hyperactivity disorder, unspecified type: Secondary | ICD-10-CM

## 2018-02-10 MED ORDER — METHYLPHENIDATE HCL ER (LA) 40 MG PO CP24: 40.0000 mg | ORAL_CAPSULE | Freq: Every day | ORAL | 0 refills | Status: DC

## 2018-02-18 ENCOUNTER — Encounter (HOSPITAL_COMMUNITY): Payer: Self-pay | Admitting: Psychiatry

## 2018-02-20 NOTE — Telephone Encounter (Signed)
Hey there, can someone help this lady out.  Thank you!

## 2018-02-26 ENCOUNTER — Other Ambulatory Visit: Payer: Self-pay | Admitting: Family Medicine

## 2018-02-26 DIAGNOSIS — G8929 Other chronic pain: Secondary | ICD-10-CM

## 2018-02-26 DIAGNOSIS — M545 Low back pain: Principal | ICD-10-CM

## 2018-02-27 ENCOUNTER — Ambulatory Visit (HOSPITAL_COMMUNITY): Payer: BLUE CROSS/BLUE SHIELD | Admitting: Psychiatry

## 2018-03-10 ENCOUNTER — Ambulatory Visit (HOSPITAL_COMMUNITY): Payer: BLUE CROSS/BLUE SHIELD | Admitting: Psychiatry

## 2018-04-01 ENCOUNTER — Encounter (HOSPITAL_COMMUNITY): Payer: Self-pay | Admitting: Psychiatry

## 2018-04-01 ENCOUNTER — Encounter: Payer: Self-pay | Admitting: Family Medicine

## 2018-04-01 DIAGNOSIS — E119 Type 2 diabetes mellitus without complications: Secondary | ICD-10-CM

## 2018-04-01 DIAGNOSIS — F909 Attention-deficit hyperactivity disorder, unspecified type: Secondary | ICD-10-CM

## 2018-04-01 DIAGNOSIS — G250 Essential tremor: Secondary | ICD-10-CM

## 2018-04-01 DIAGNOSIS — F341 Dysthymic disorder: Secondary | ICD-10-CM

## 2018-04-01 MED ORDER — PROPRANOLOL HCL ER 60 MG PO CP24: 60.0000 mg | ORAL_CAPSULE | Freq: Every day | ORAL | 0 refills | Status: DC

## 2018-04-01 MED ORDER — CLOMIPRAMINE HCL 75 MG PO CAPS: 225.0000 mg | ORAL_CAPSULE | Freq: Every day | ORAL | 0 refills | Status: AC

## 2018-04-01 MED ORDER — GABAPENTIN 300 MG PO CAPS: 1200.0000 mg | ORAL_CAPSULE | Freq: Every day | ORAL | 0 refills | Status: DC

## 2018-04-02 ENCOUNTER — Telehealth: Payer: Self-pay | Admitting: Family Medicine

## 2018-04-02 DIAGNOSIS — M545 Low back pain: Secondary | ICD-10-CM

## 2018-04-02 DIAGNOSIS — E119 Type 2 diabetes mellitus without complications: Secondary | ICD-10-CM

## 2018-04-02 DIAGNOSIS — G8929 Other chronic pain: Secondary | ICD-10-CM

## 2018-04-02 DIAGNOSIS — J4540 Moderate persistent asthma, uncomplicated: Secondary | ICD-10-CM

## 2018-04-02 DIAGNOSIS — E039 Hypothyroidism, unspecified: Secondary | ICD-10-CM

## 2018-04-02 MED ORDER — ESOMEPRAZOLE MAGNESIUM 40 MG PO CPDR: 40.0000 mg | DELAYED_RELEASE_CAPSULE | Freq: Every day | ORAL | 1 refills | Status: DC

## 2018-04-02 MED ORDER — GLYBURIDE 5 MG PO TABS: 10.0000 mg | ORAL_TABLET | Freq: Two times a day (BID) | ORAL | 0 refills | Status: DC

## 2018-04-02 MED ORDER — LISINOPRIL 20 MG PO TABS: 20.0000 mg | ORAL_TABLET | Freq: Every day | ORAL | 1 refills | Status: DC

## 2018-04-02 MED ORDER — ATORVASTATIN CALCIUM 40 MG PO TABS: 40.0000 mg | ORAL_TABLET | Freq: Every day | ORAL | 3 refills | Status: DC

## 2018-04-02 MED ORDER — PIOGLITAZONE HCL 45 MG PO TABS: 45.0000 mg | ORAL_TABLET | Freq: Every day | ORAL | 1 refills | Status: DC

## 2018-04-02 MED ORDER — DAPAGLIFLOZIN PROPANEDIOL 10 MG PO TABS: 10.0000 mg | ORAL_TABLET | Freq: Every day | ORAL | 1 refills | Status: DC

## 2018-04-02 MED ORDER — LEVOTHYROXINE SODIUM 100 MCG PO TABS: 100.0000 ug | ORAL_TABLET | Freq: Every day | ORAL | 1 refills | Status: DC

## 2018-04-02 MED ORDER — METFORMIN HCL 1000 MG PO TABS: 1000.0000 mg | ORAL_TABLET | Freq: Two times a day (BID) | ORAL | 1 refills | Status: DC

## 2018-04-02 MED ORDER — NAPROXEN 500 MG PO TABS: 500.0000 mg | ORAL_TABLET | Freq: Every day | ORAL | 1 refills | Status: DC

## 2018-04-02 MED ORDER — FLUTICASONE-SALMETEROL 250-50 MCG/DOSE IN AEPB: 1.0000 | INHALATION_SPRAY | Freq: Two times a day (BID) | RESPIRATORY_TRACT | 1 refills | Status: DC

## 2018-04-02 NOTE — Telephone Encounter (Signed)
° °  Pt return Robin call and would like a call back

## 2018-04-02 NOTE — Telephone Encounter (Signed)
Updated list and sent into pharmacy---patient informed

## 2018-04-02 NOTE — Telephone Encounter (Signed)
Called the patient left message to call back regarding 90 day refills for out [place] trip this summer.

## 2018-04-02 NOTE — Telephone Encounter (Signed)
OK on Advair. She is on the max dose for [place]. Ty.

## 2018-04-02 NOTE — Addendum Note (Signed)
Addended by: Sharon Seller B on: 04/02/2018 01:14 PM   Modules accepted: Orders

## 2018-04-02 NOTE — Telephone Encounter (Signed)
The patient has questions regarding the Advair and [place]. Advair---she does not want the diskus---just the plain inhaler. Farxiga---instructions state she takes once daily, but she thinks it should be twice daily? Advise on both and then let me know as she needs #90 day refill for both since going out of the country

## 2018-06-10 ENCOUNTER — Other Ambulatory Visit: Payer: Self-pay | Admitting: Family Medicine

## 2018-06-10 DIAGNOSIS — Z1231 Encounter for screening mammogram for malignant neoplasm of breast: Secondary | ICD-10-CM

## 2018-07-05 ENCOUNTER — Ambulatory Visit (HOSPITAL_BASED_OUTPATIENT_CLINIC_OR_DEPARTMENT_OTHER): Payer: Self-pay

## 2018-07-12 ENCOUNTER — Ambulatory Visit (HOSPITAL_BASED_OUTPATIENT_CLINIC_OR_DEPARTMENT_OTHER)
Admission: RE | Admit: 2018-07-12 | Discharge: 2018-07-12 | Disposition: A | Discharge status: Home / Self Care | Payer: BLUE CROSS/BLUE SHIELD | Source: Ambulatory Visit | Attending: Family Medicine | Admitting: Family Medicine

## 2018-07-12 DIAGNOSIS — Z1231 Encounter for screening mammogram for malignant neoplasm of breast: Secondary | ICD-10-CM

## 2018-08-29 ENCOUNTER — Other Ambulatory Visit: Payer: Self-pay | Admitting: Family Medicine

## 2018-08-29 ENCOUNTER — Encounter: Payer: Self-pay | Admitting: Family Medicine

## 2018-08-29 ENCOUNTER — Ambulatory Visit: Payer: BLUE CROSS/BLUE SHIELD | Admitting: Family Medicine

## 2018-08-29 VITALS — BP 110/68 | HR 70 | Temp 98.0°F | Ht 64.0 in | Wt 169.0 lb

## 2018-08-29 DIAGNOSIS — E119 Type 2 diabetes mellitus without complications: Secondary | ICD-10-CM

## 2018-08-29 DIAGNOSIS — E039 Hypothyroidism, unspecified: Secondary | ICD-10-CM | POA: Diagnosis not present

## 2018-08-29 DIAGNOSIS — Z23 Encounter for immunization: Secondary | ICD-10-CM | POA: Diagnosis not present

## 2018-08-29 LAB — HEMOGLOBIN A1C: Hgb A1c MFr Bld: 7.4 % — ABNORMAL HIGH (ref 4.6–6.5)

## 2018-08-29 MED ORDER — PIOGLITAZONE HCL 45 MG PO TABS: 45.0000 mg | ORAL_TABLET | Freq: Every day | ORAL | 1 refills | Status: DC

## 2018-08-29 MED ORDER — FLUTICASONE-SALMETEROL 250-50 MCG/DOSE IN AEPB: 1.0000 | INHALATION_SPRAY | Freq: Two times a day (BID) | RESPIRATORY_TRACT | 1 refills | Status: DC

## 2018-08-29 MED ORDER — LEVOTHYROXINE SODIUM 100 MCG PO TABS: 100.0000 ug | ORAL_TABLET | Freq: Every day | ORAL | 1 refills | Status: DC

## 2018-08-29 MED ORDER — METFORMIN HCL 1000 MG PO TABS: 1000.0000 mg | ORAL_TABLET | Freq: Two times a day (BID) | ORAL | 1 refills | Status: DC

## 2018-08-29 MED ORDER — LISINOPRIL 20 MG PO TABS: 20.0000 mg | ORAL_TABLET | Freq: Every day | ORAL | 1 refills | Status: DC

## 2018-08-29 MED ORDER — GLYBURIDE 5 MG PO TABS: 10.0000 mg | ORAL_TABLET | Freq: Two times a day (BID) | ORAL | 1 refills | Status: DC

## 2018-08-29 MED ORDER — GLUCOSE BLOOD VI STRP: ORAL_STRIP | 2 refills | Status: DC

## 2018-08-29 MED ORDER — GLUCOSE BLOOD VI STRP: ORAL_STRIP | 12 refills | Status: DC

## 2018-08-29 MED ORDER — ESOMEPRAZOLE MAGNESIUM 40 MG PO CPDR: 40.0000 mg | DELAYED_RELEASE_CAPSULE | Freq: Every day | ORAL | 1 refills | Status: DC

## 2018-08-29 MED ORDER — ACCU-CHEK GUIDE ME W/DEVICE KIT: 1.0000 "application " | PACK | Freq: Every day | 0 refills | Status: DC

## 2018-08-29 MED ORDER — GLUCOSE BLOOD VI STRP: ORAL_STRIP | 3 refills | Status: DC

## 2018-08-29 MED ORDER — DAPAGLIFLOZIN PROPANEDIOL 10 MG PO TABS: 10.0000 mg | ORAL_TABLET | Freq: Every day | ORAL | 1 refills | Status: DC

## 2018-08-29 MED ORDER — ONETOUCH VERIO W/DEVICE KIT: PACK | 0 refills | Status: DC

## 2018-08-29 NOTE — Progress Notes (Signed)
Pre visit review using our clinic review tool, if applicable. No additional management support is needed unless otherwise documented below in the visit note. 

## 2018-08-29 NOTE — Progress Notes (Signed)
Subjective:   Chief Complaint  Patient presents with  . Diabetes    Stephanie Travis is a 47 y.o. female here for follow-up of diabetes.   Stephanie Travis  Has not been checking her sugars at home. Patient does not require insulin.   Medications include: Metformin, Actos, Glyburide, Farxiga Exercise: None currently  Hypothyroidism Patient presents for follow-up of hypothyroidism.  Reports compliance with medication- Synthroid 100 mcg/d. Current symptoms include: denies fatigue, weight changes, heat/cold intolerance, bowel/skin changes or CVS symptoms She believes her dose should be unchanged   Past Medical History:  Diagnosis Date  . Asthma   . Bipolar 1 disorder ([place]) 12/13/2016  . Depression   . Diabetes mellitus without complication ([place])   . Hypertension   . Hypothyroid 12/13/2016  . Iron (Fe) deficiency anemia   . Pinched thoracic nerve root   . Thyroid disease   . Type 2 diabetes mellitus ([place]) 12/13/2016  . Ulcer      Related testing: Date of retinal exam: Done Pneumovax: done Flu Shot: done  Review of Systems: Pulmonary:  No SOB Cardiovascular:  No chest pain  Objective:  BP 110/68 (BP Location: Left Arm, Patient Position: Sitting, Cuff Size: Normal)   Pulse 70   Temp 98 F (36.7 C) (Oral)   Ht 5\' 4"  (1.626 m)   Wt 169 lb (76.7 kg)   SpO2 99%   BMI 29.01 kg/m  General:  Well developed, well nourished, in no apparent distress Skin:  Warm, no pallor or diaphoresis Head:  Normocephalic, atraumatic Eyes:  Pupils equal and round, sclera anicteric without injection  Lungs:  CTAB, no access msc use Cardio:  RRR, no bruits, no LE edema Musculoskeletal:  Symmetrical muscle groups noted without atrophy or deformity Neuro:  Sensation intact to pinprick on feet Psych: Age appropriate judgment and insight  Assessment:   Type 2 diabetes mellitus without complication, without long-term current use of insulin (HCC) - Plan: glyBURIDE (DIABETA) 5 MG tablet, lisinopril  (PRINIVIL,ZESTRIL) 20 MG tablet, Hemoglobin A1c, HM DIABETES FOOT EXAM  Need for influenza vaccination - Plan: Flu Vaccine QUAD 6+ mos PF IM (Fluarix Quad PF)  Hypothyroidism, unspecified type - Plan: levothyroxine (SYNTHROID, LEVOTHROID) 100 MCG tablet   Plan:   Orders as above. Would like to order other things, but she is on a budget and will plan to hold off on this until next year.  F/u in 3 mo for CPE. The patient voiced understanding and agreement to the plan.  [place], DO 08/29/18 11:29 AM

## 2018-08-29 NOTE — Patient Instructions (Signed)
Give [name] 2-3 business days to get the results of your labs back.   Aim to do some physical exertion for 150 minutes per week. This is typically divided into 5 days per week, 30 minutes per day. The activity should be enough to get your heart rate up. Anything is better than nothing if you have time constraints.  Keep the diet clean.  Let us know if you need anything.

## 2018-09-29 DIAGNOSIS — E039 Hypothyroidism, unspecified: Secondary | ICD-10-CM | POA: Diagnosis not present

## 2018-09-29 DIAGNOSIS — Z23 Encounter for immunization: Secondary | ICD-10-CM | POA: Diagnosis not present

## 2018-09-29 DIAGNOSIS — Z0000 Encounter for general adult medical examination without abnormal findings: Secondary | ICD-10-CM | POA: Diagnosis not present

## 2018-09-29 DIAGNOSIS — E1165 Type 2 diabetes mellitus with hyperglycemia: Secondary | ICD-10-CM | POA: Diagnosis not present

## 2018-09-29 DIAGNOSIS — I10 Essential (primary) hypertension: Secondary | ICD-10-CM | POA: Diagnosis not present

## 2018-09-29 DIAGNOSIS — J4540 Moderate persistent asthma, uncomplicated: Secondary | ICD-10-CM | POA: Diagnosis not present

## 2018-09-30 ENCOUNTER — Other Ambulatory Visit: Payer: Self-pay | Admitting: Family Medicine

## 2018-09-30 ENCOUNTER — Ambulatory Visit
Admission: RE | Admit: 2018-09-30 | Discharge: 2018-09-30 | Disposition: A | Discharge status: Home / Self Care | Payer: BLUE CROSS/BLUE SHIELD | Source: Ambulatory Visit | Attending: Family Medicine | Admitting: Family Medicine

## 2018-09-30 DIAGNOSIS — W19XXXA Unspecified fall, initial encounter: Secondary | ICD-10-CM

## 2018-09-30 DIAGNOSIS — M533 Sacrococcygeal disorders, not elsewhere classified: Secondary | ICD-10-CM | POA: Diagnosis not present

## 2018-09-30 DIAGNOSIS — S3992XA Unspecified injury of lower back, initial encounter: Secondary | ICD-10-CM | POA: Diagnosis not present

## 2018-10-27 ENCOUNTER — Ambulatory Visit: Payer: Self-pay | Admitting: Obstetrics & Gynecology

## 2018-11-08 IMAGING — MG DIGITAL SCREENING BILATERAL MAMMOGRAM WITH CAD
4 series · 4 of 4 positions shown · non-contrast
Comparison: None.

CLINICAL DATA: Screening.

EXAM:
DIGITAL SCREENING BILATERAL MAMMOGRAM WITH CAD

[R MLO]
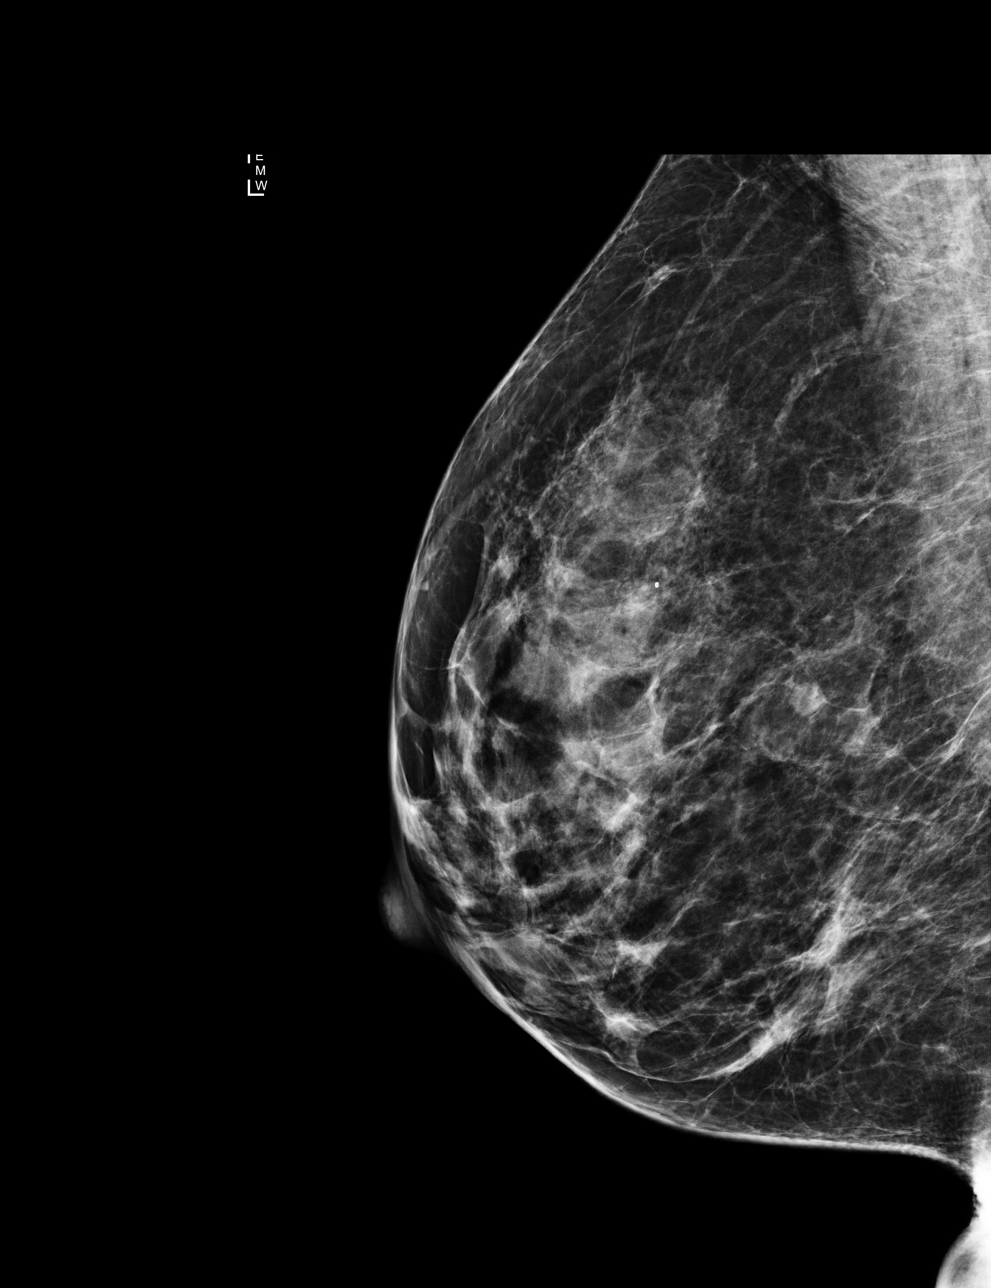

[L CC]
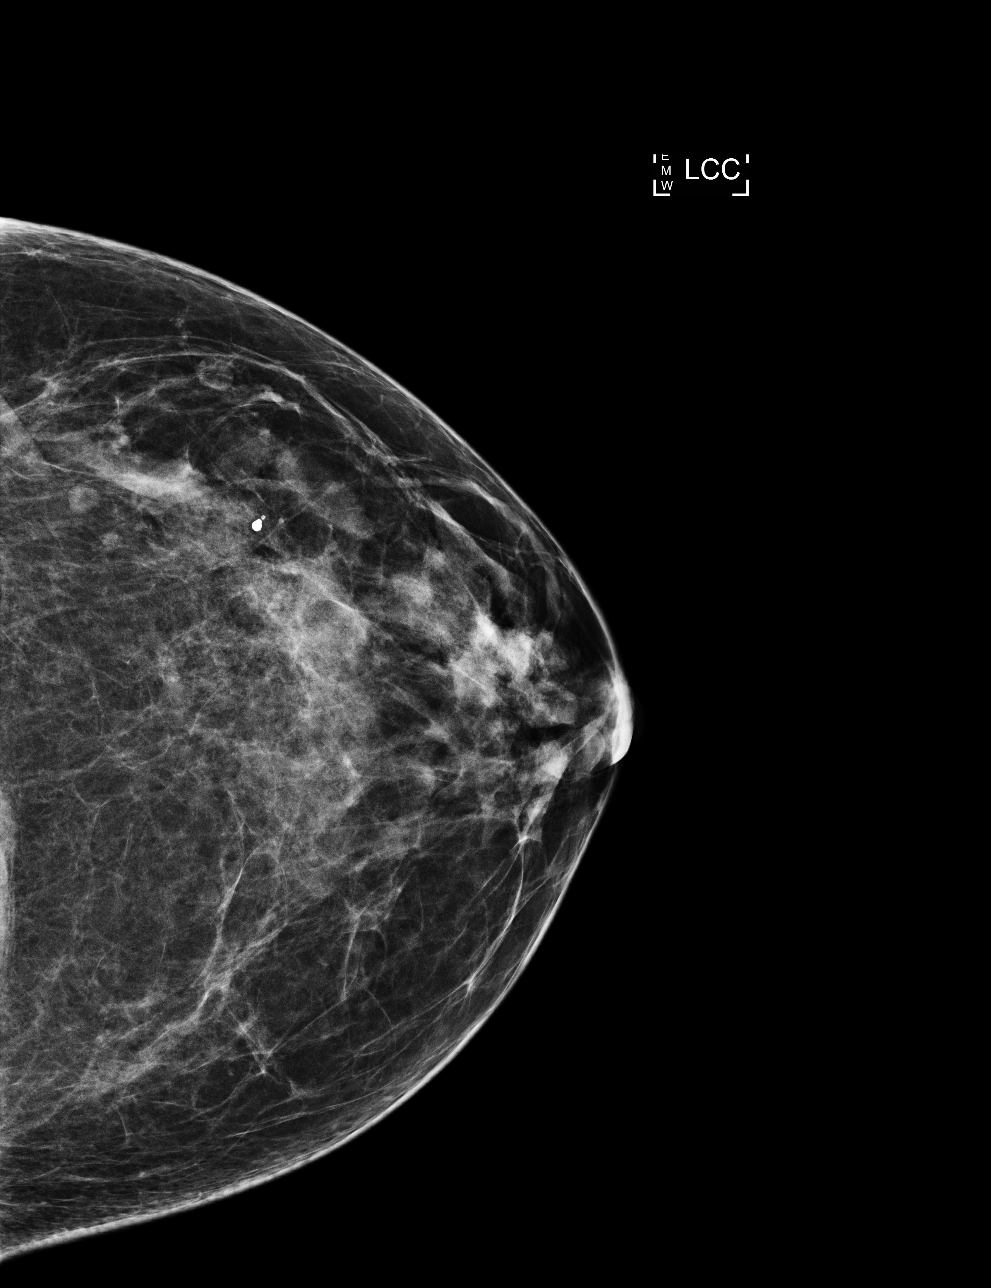

[L MLO]
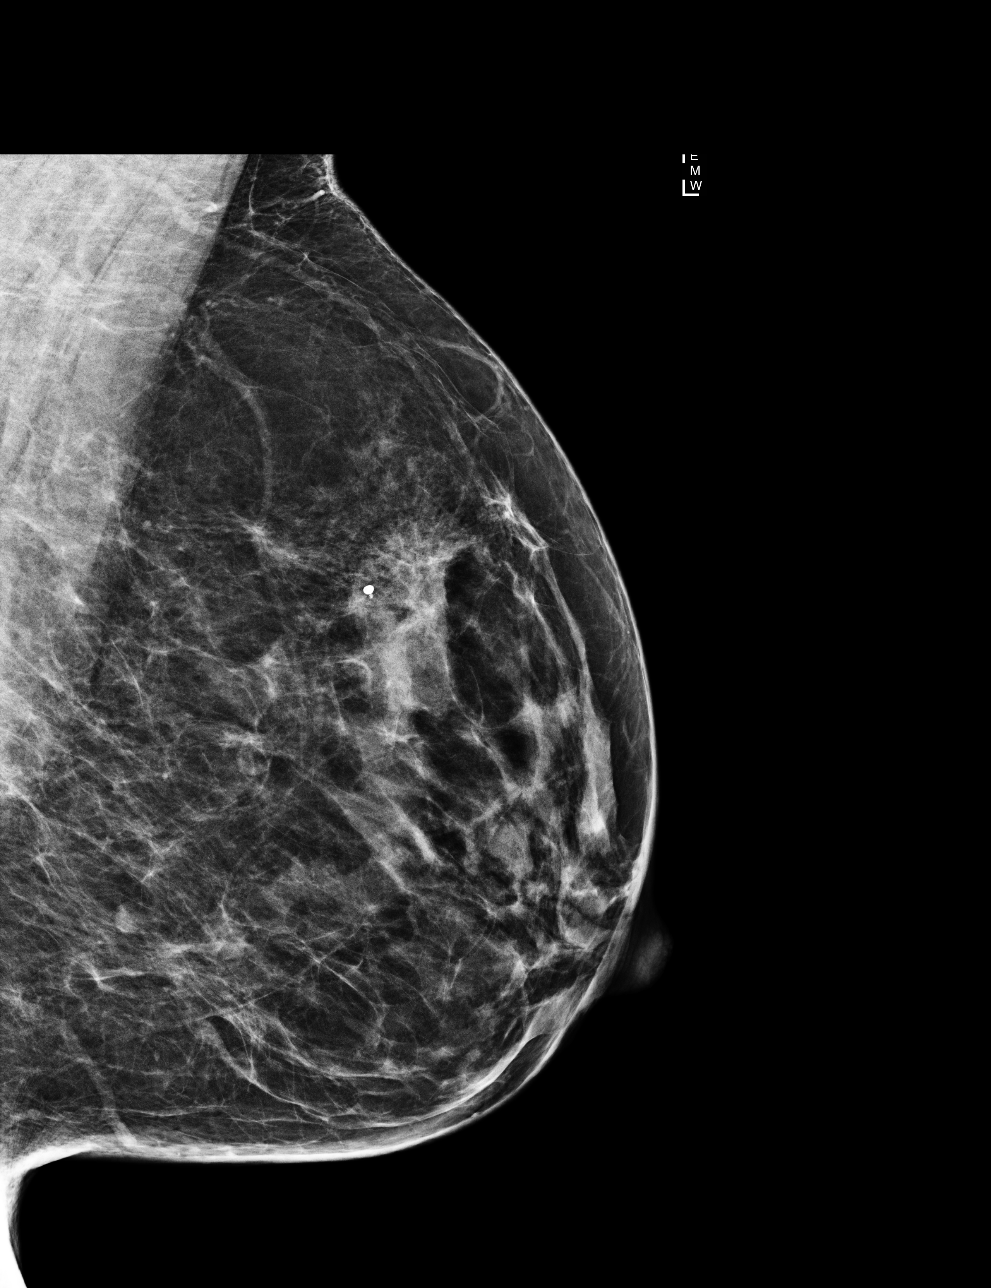

[R CC]
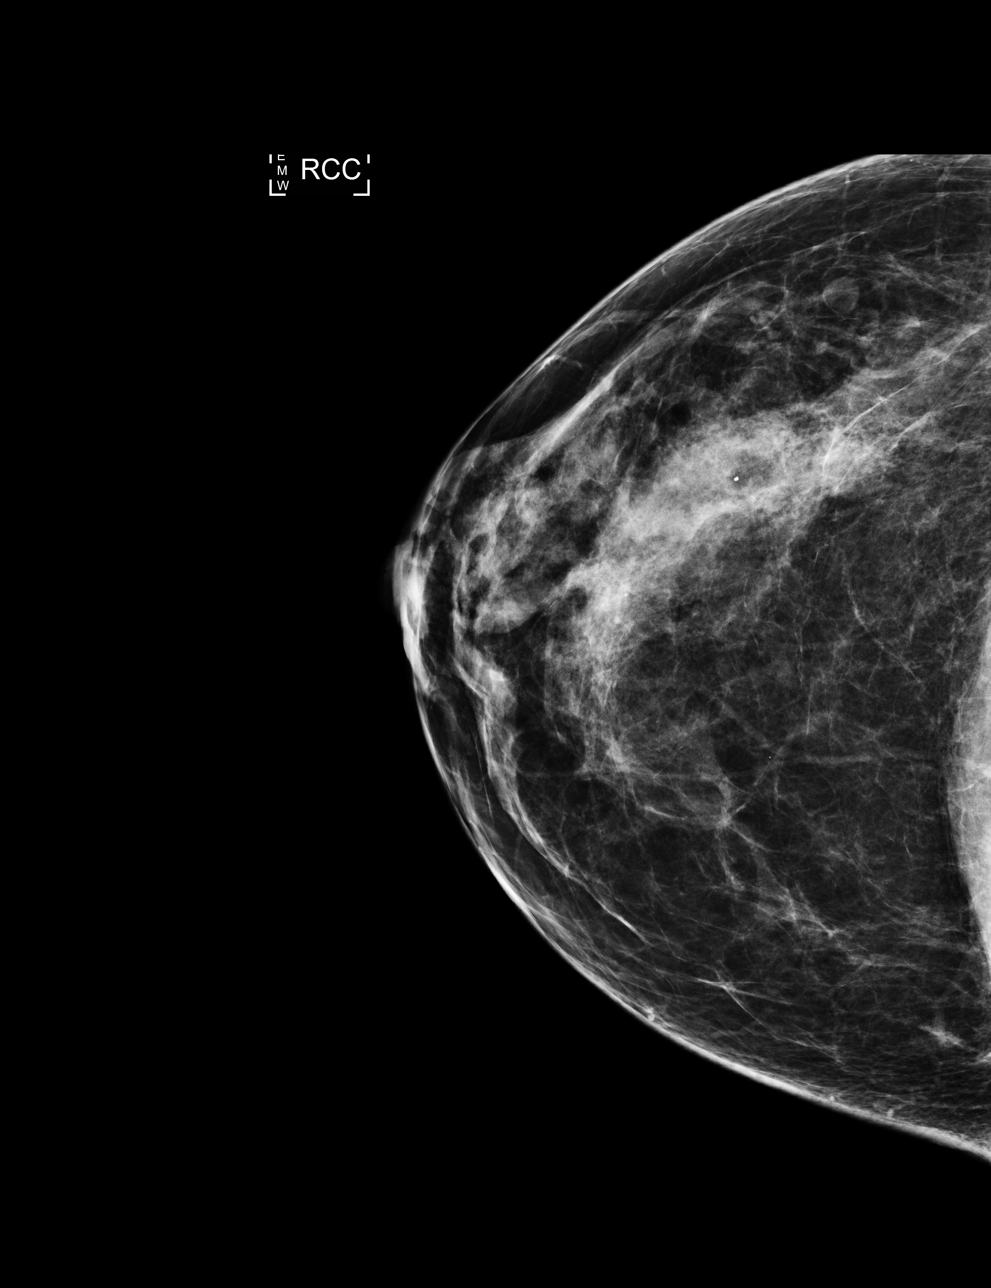

[4 of 4 positions shown; findings below may reference images not displayed]

ACR Breast Density Category c: The breast tissue is heterogeneously
dense, which may obscure small masses
FINDINGS: There are no findings suspicious for malignancy. Images were
processed with CAD.
IMPRESSION: No mammographic evidence of malignancy. A result letter of this
screening mammogram will be mailed directly to the patient.

RECOMMENDATION:
Screening mammogram in one year. (Code:U2-0-761)

BI-RADS CATEGORY  1: Negative.

## 2018-11-10 DIAGNOSIS — I10 Essential (primary) hypertension: Secondary | ICD-10-CM | POA: Diagnosis not present

## 2018-11-10 DIAGNOSIS — Z0000 Encounter for general adult medical examination without abnormal findings: Secondary | ICD-10-CM | POA: Diagnosis not present

## 2018-11-10 DIAGNOSIS — E7800 Pure hypercholesterolemia, unspecified: Secondary | ICD-10-CM | POA: Diagnosis not present

## 2018-11-10 DIAGNOSIS — E1165 Type 2 diabetes mellitus with hyperglycemia: Secondary | ICD-10-CM | POA: Diagnosis not present

## 2018-11-10 DIAGNOSIS — E039 Hypothyroidism, unspecified: Secondary | ICD-10-CM | POA: Diagnosis not present

## 2018-11-30 ENCOUNTER — Other Ambulatory Visit: Payer: Self-pay | Admitting: Family Medicine

## 2018-11-30 DIAGNOSIS — G8929 Other chronic pain: Secondary | ICD-10-CM

## 2018-11-30 DIAGNOSIS — M545 Low back pain, unspecified: Secondary | ICD-10-CM

## 2018-12-01 ENCOUNTER — Encounter: Payer: Self-pay | Admitting: Family Medicine

## 2019-02-16 ENCOUNTER — Other Ambulatory Visit: Payer: Self-pay | Admitting: Family Medicine

## 2019-02-16 DIAGNOSIS — E119 Type 2 diabetes mellitus without complications: Secondary | ICD-10-CM

## 2019-02-16 DIAGNOSIS — E039 Hypothyroidism, unspecified: Secondary | ICD-10-CM

## 2019-07-20 ENCOUNTER — Other Ambulatory Visit: Payer: Self-pay | Admitting: Family Medicine

## 2019-07-22 ENCOUNTER — Other Ambulatory Visit (HOSPITAL_BASED_OUTPATIENT_CLINIC_OR_DEPARTMENT_OTHER): Payer: Self-pay | Admitting: Family Medicine

## 2019-07-22 DIAGNOSIS — Z1231 Encounter for screening mammogram for malignant neoplasm of breast: Secondary | ICD-10-CM

## 2019-07-29 ENCOUNTER — Other Ambulatory Visit: Payer: Self-pay

## 2019-07-29 ENCOUNTER — Ambulatory Visit (HOSPITAL_BASED_OUTPATIENT_CLINIC_OR_DEPARTMENT_OTHER)
Admission: RE | Admit: 2019-07-29 | Discharge: 2019-07-29 | Disposition: A | Discharge status: Home / Self Care | Payer: 59 | Source: Ambulatory Visit | Attending: Family Medicine | Admitting: Family Medicine

## 2019-07-29 DIAGNOSIS — Z1231 Encounter for screening mammogram for malignant neoplasm of breast: Secondary | ICD-10-CM | POA: Diagnosis not present

## 2019-10-14 ENCOUNTER — Other Ambulatory Visit: Payer: Self-pay | Admitting: Family Medicine

## 2019-10-29 ENCOUNTER — Other Ambulatory Visit: Payer: Self-pay | Admitting: Family Medicine

## 2020-07-08 ENCOUNTER — Other Ambulatory Visit (HOSPITAL_BASED_OUTPATIENT_CLINIC_OR_DEPARTMENT_OTHER): Payer: Self-pay | Admitting: Family Medicine

## 2020-07-08 DIAGNOSIS — Z1231 Encounter for screening mammogram for malignant neoplasm of breast: Secondary | ICD-10-CM

## 2020-07-18 ENCOUNTER — Ambulatory Visit (HOSPITAL_BASED_OUTPATIENT_CLINIC_OR_DEPARTMENT_OTHER)
Admission: RE | Admit: 2020-07-18 | Discharge: 2020-07-18 | Disposition: A | Discharge status: Home / Self Care | Payer: 59 | Source: Ambulatory Visit | Attending: Family Medicine | Admitting: Family Medicine

## 2020-07-18 ENCOUNTER — Encounter (HOSPITAL_BASED_OUTPATIENT_CLINIC_OR_DEPARTMENT_OTHER): Payer: Self-pay

## 2020-07-18 ENCOUNTER — Other Ambulatory Visit: Payer: Self-pay

## 2020-07-18 DIAGNOSIS — Z1231 Encounter for screening mammogram for malignant neoplasm of breast: Secondary | ICD-10-CM | POA: Insufficient documentation

## 2021-10-31 ENCOUNTER — Other Ambulatory Visit (HOSPITAL_BASED_OUTPATIENT_CLINIC_OR_DEPARTMENT_OTHER): Payer: Self-pay | Admitting: Family Medicine

## 2021-10-31 DIAGNOSIS — Z1231 Encounter for screening mammogram for malignant neoplasm of breast: Secondary | ICD-10-CM

## 2021-11-10 ENCOUNTER — Encounter (HOSPITAL_BASED_OUTPATIENT_CLINIC_OR_DEPARTMENT_OTHER): Payer: Self-pay

## 2021-11-10 ENCOUNTER — Ambulatory Visit (HOSPITAL_BASED_OUTPATIENT_CLINIC_OR_DEPARTMENT_OTHER)
Admission: RE | Admit: 2021-11-10 | Discharge: 2021-11-10 | Disposition: A | Discharge status: Home / Self Care | Payer: 59 | Source: Ambulatory Visit | Attending: Family Medicine | Admitting: Family Medicine

## 2021-11-10 ENCOUNTER — Other Ambulatory Visit: Payer: Self-pay

## 2021-11-10 DIAGNOSIS — Z1231 Encounter for screening mammogram for malignant neoplasm of breast: Secondary | ICD-10-CM | POA: Insufficient documentation

## 2021-11-24 ENCOUNTER — Other Ambulatory Visit: Payer: Self-pay | Admitting: Family Medicine

## 2021-11-24 DIAGNOSIS — K7011 Alcoholic hepatitis with ascites: Secondary | ICD-10-CM

## 2021-11-24 DIAGNOSIS — E7800 Pure hypercholesterolemia, unspecified: Secondary | ICD-10-CM

## 2021-11-24 DIAGNOSIS — F1021 Alcohol dependence, in remission: Secondary | ICD-10-CM

## 2021-11-30 ENCOUNTER — Ambulatory Visit
Admission: RE | Admit: 2021-11-30 | Discharge: 2021-11-30 | Disposition: A | Discharge status: Home / Self Care | Payer: 59 | Source: Ambulatory Visit | Attending: Family Medicine | Admitting: Family Medicine

## 2021-11-30 ENCOUNTER — Other Ambulatory Visit: Payer: Self-pay | Admitting: Family Medicine

## 2021-11-30 ENCOUNTER — Other Ambulatory Visit: Payer: Self-pay

## 2021-11-30 ENCOUNTER — Ambulatory Visit
Admission: RE | Admit: 2021-11-30 | Discharge: 2021-11-30 | Disposition: A | Discharge status: Home / Self Care | Payer: 59 | Attending: Family Medicine | Admitting: Family Medicine

## 2021-11-30 DIAGNOSIS — E7800 Pure hypercholesterolemia, unspecified: Secondary | ICD-10-CM | POA: Diagnosis present

## 2021-11-30 DIAGNOSIS — F1021 Alcohol dependence, in remission: Secondary | ICD-10-CM | POA: Insufficient documentation

## 2021-11-30 DIAGNOSIS — R198 Other specified symptoms and signs involving the digestive system and abdomen: Secondary | ICD-10-CM | POA: Insufficient documentation

## 2021-11-30 DIAGNOSIS — K7011 Alcoholic hepatitis with ascites: Secondary | ICD-10-CM | POA: Insufficient documentation

## 2021-11-30 DIAGNOSIS — R197 Diarrhea, unspecified: Secondary | ICD-10-CM | POA: Insufficient documentation

## 2021-12-01 ENCOUNTER — Ambulatory Visit
Admission: RE | Admit: 2021-12-01 | Discharge: 2021-12-01 | Disposition: A | Discharge status: Home / Self Care | Payer: 59 | Source: Ambulatory Visit | Attending: Family Medicine | Admitting: Family Medicine

## 2021-12-01 DIAGNOSIS — F1021 Alcohol dependence, in remission: Secondary | ICD-10-CM

## 2021-12-01 DIAGNOSIS — E7800 Pure hypercholesterolemia, unspecified: Secondary | ICD-10-CM | POA: Diagnosis not present

## 2021-12-01 DIAGNOSIS — K7011 Alcoholic hepatitis with ascites: Secondary | ICD-10-CM

## 2022-08-03 ENCOUNTER — Other Ambulatory Visit: Payer: Self-pay | Admitting: Internal Medicine

## 2022-08-03 ENCOUNTER — Other Ambulatory Visit (HOSPITAL_COMMUNITY): Payer: Self-pay | Admitting: Internal Medicine

## 2022-08-03 ENCOUNTER — Encounter: Payer: Self-pay | Admitting: Internal Medicine

## 2022-08-03 DIAGNOSIS — K769 Liver disease, unspecified: Secondary | ICD-10-CM

## 2022-08-03 DIAGNOSIS — Z8719 Personal history of other diseases of the digestive system: Secondary | ICD-10-CM

## 2022-08-15 ENCOUNTER — Ambulatory Visit
Admission: RE | Admit: 2022-08-15 | Discharge: 2022-08-15 | Disposition: A | Discharge status: Home / Self Care | Payer: 59 | Source: Ambulatory Visit | Attending: Internal Medicine | Admitting: Internal Medicine

## 2022-08-15 DIAGNOSIS — K769 Liver disease, unspecified: Secondary | ICD-10-CM | POA: Diagnosis present

## 2022-08-15 DIAGNOSIS — Z8719 Personal history of other diseases of the digestive system: Secondary | ICD-10-CM | POA: Diagnosis present

## 2022-08-15 MED ORDER — GADOBUTROL 1 MMOL/ML IV SOLN
7.0000 mL | Freq: Once | INTRAVENOUS | Status: AC | PRN
  Administered 2022-08-15: 7 mL via INTRAVENOUS

## 2022-11-15 ENCOUNTER — Other Ambulatory Visit: Payer: Self-pay | Admitting: Family Medicine

## 2022-11-15 DIAGNOSIS — Z1231 Encounter for screening mammogram for malignant neoplasm of breast: Secondary | ICD-10-CM

## 2022-11-27 ENCOUNTER — Ambulatory Visit (INDEPENDENT_AMBULATORY_CARE_PROVIDER_SITE_OTHER): Payer: 59 | Admitting: Obstetrics & Gynecology

## 2022-11-27 ENCOUNTER — Encounter: Payer: Self-pay | Admitting: Licensed Practical Nurse

## 2022-11-27 VITALS — BP 120/80 | Ht 63.0 in | Wt 159.0 lb

## 2022-11-27 DIAGNOSIS — N841 Polyp of cervix uteri: Secondary | ICD-10-CM

## 2022-11-29 ENCOUNTER — Encounter: Payer: Self-pay | Admitting: Licensed Practical Nurse

## 2022-12-01 LAB — ANATOMIC PATHOLOGY REPORT

## 2022-12-04 NOTE — Progress Notes (Signed)
   Established Patient Office Visit  Subjective   Patient ID: Albertia Carvin, female    DOB: 04-Mar-1971  Age: 52 y.o. MRN: [id number]  Chief Complaint  Patient presents with   cervical polyp    HPI   This lady is here to have a cervical polyp removed. She denies other issues today. Objective:     BP 120/80   Ht '5\' 3"'$  (1.6 m)   Wt 159 lb (72.1 kg)   LMP 11/15/2022   BMI 28.17 kg/m    Physical Exam   Well nourished, well hydrated White female, no apparent distress She is ambulating and conversing normally. I placed a speculum, visualized the polyp on a stalk.  I prepped the cervix with betadine and twisted until the polyp came free. I cauterized the base with silver nitrate.  She tolerated the procedure well.  Assessment & Plan:   Problem List Items Addressed This Visit   None Visit Diagnoses     Cervical polyp    -  Primary   Relevant Orders   Pathology ([facility]) (Completed)       No follow-ups on file.    Emily Filbert, MD

## 2022-12-05 ENCOUNTER — Ambulatory Visit
Admission: RE | Admit: 2022-12-05 | Discharge: 2022-12-05 | Disposition: A | Discharge status: Home / Self Care | Payer: Managed Care, Other (non HMO) | Source: Ambulatory Visit | Attending: Family Medicine | Admitting: Family Medicine

## 2022-12-05 DIAGNOSIS — Z1231 Encounter for screening mammogram for malignant neoplasm of breast: Secondary | ICD-10-CM | POA: Insufficient documentation

## 2023-04-09 IMAGING — US US ABDOMEN COMPLETE
1 series · 13 of 25 positions shown · non-contrast
Comparison: KUB, 11/30/2021

CLINICAL DATA: assess for presence of potential cirrhosis and for
ascites/splenomegaly

EXAM:
ABDOMEN ULTRASOUND COMPLETE

[Series 1: us abdomen complete · 0.19mm/px · 13 of 83 slices shown]
[im 1/83]
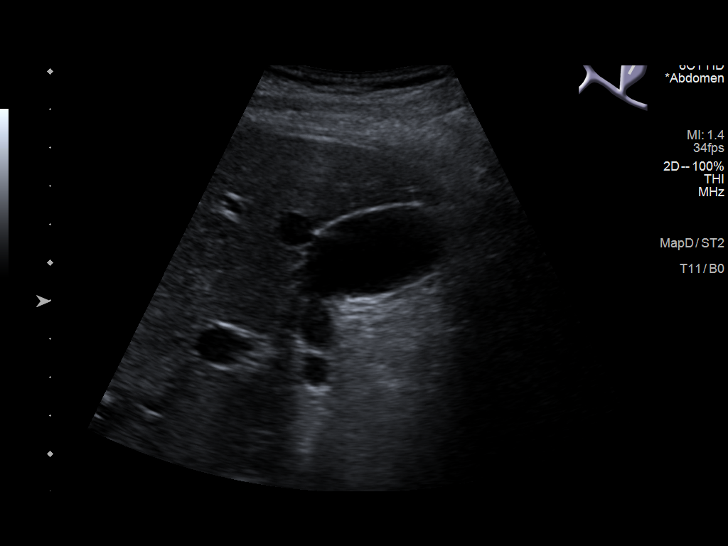
[im 7/83]
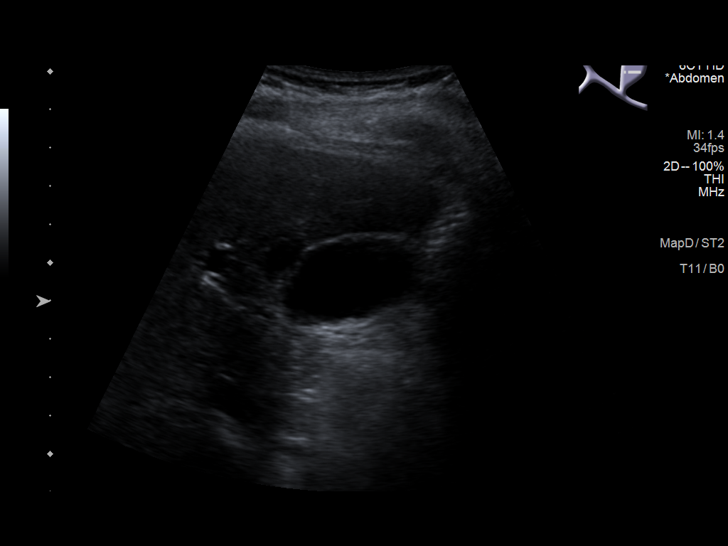
[im 14/83]
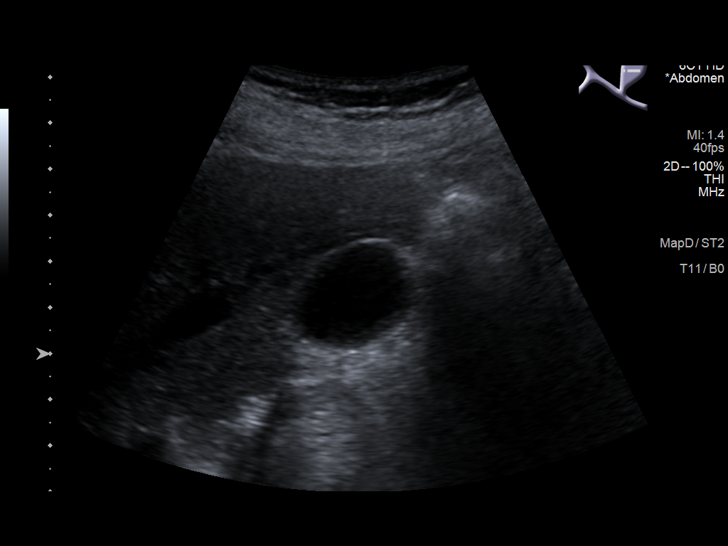
[im 21/83]
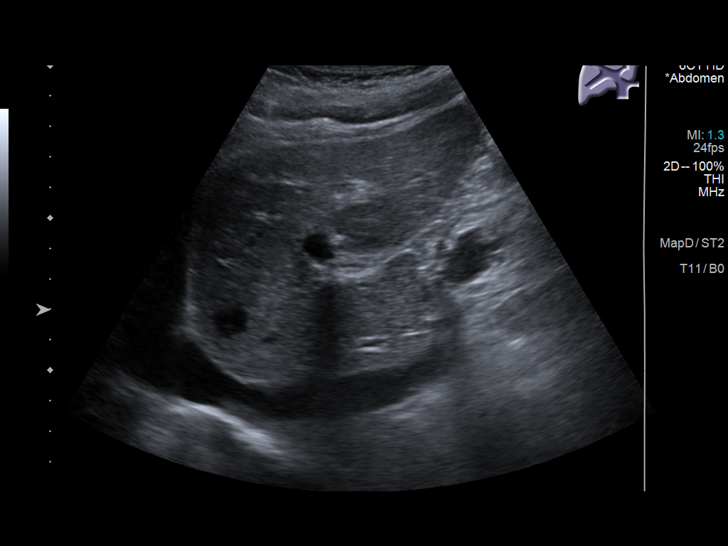
[im 28/83]
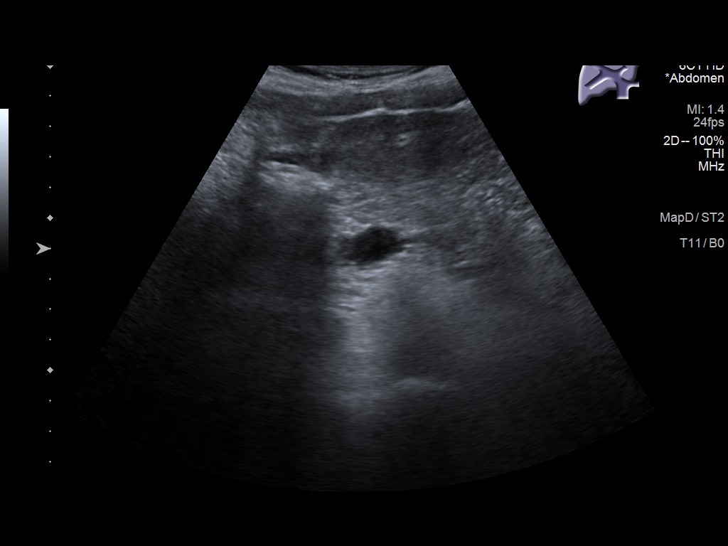
[im 35/83]
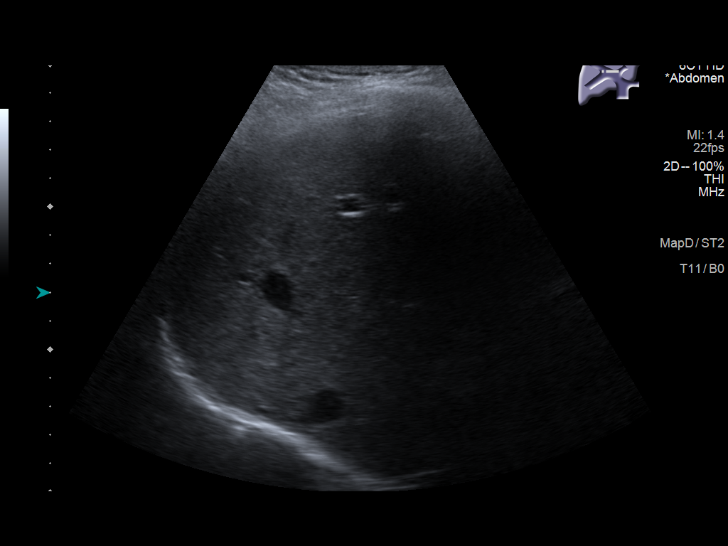
[im 42/83]
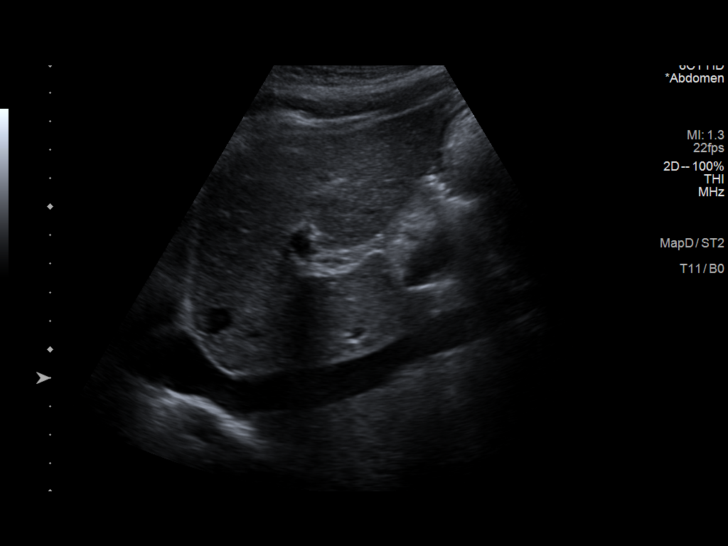
[im 48/83]
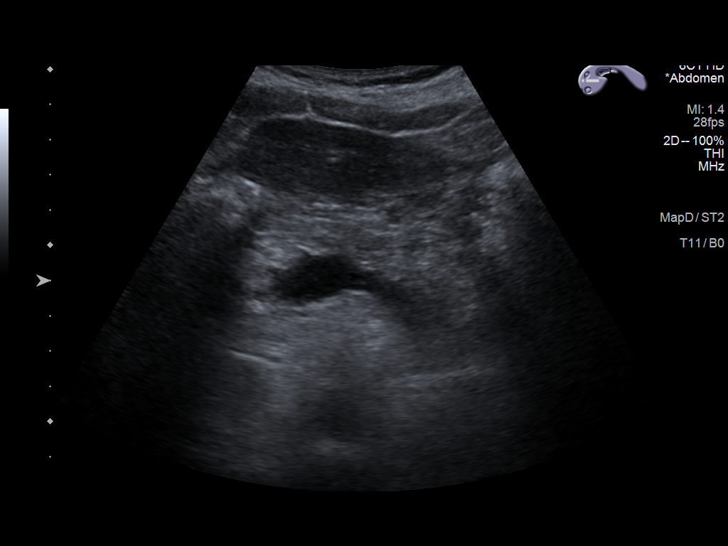
[im 55/83]
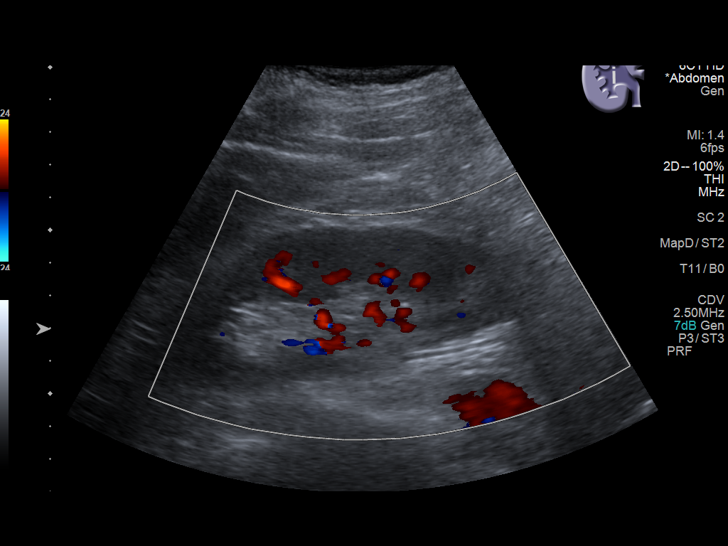
[im 62/83]
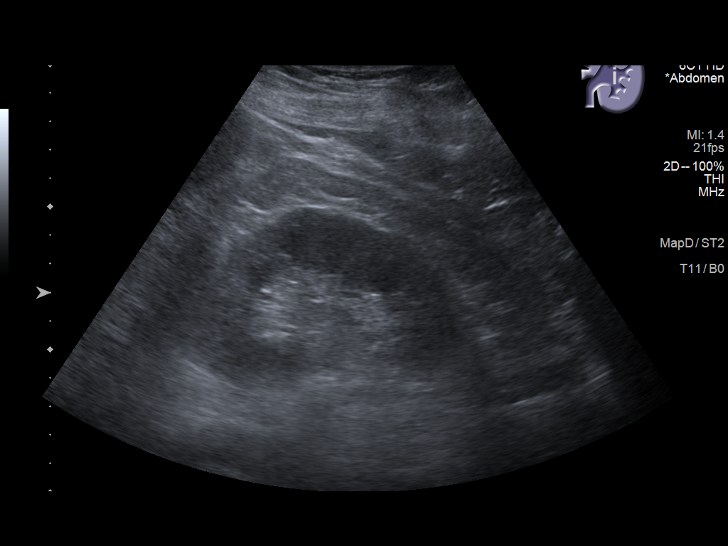
[im 69/83]
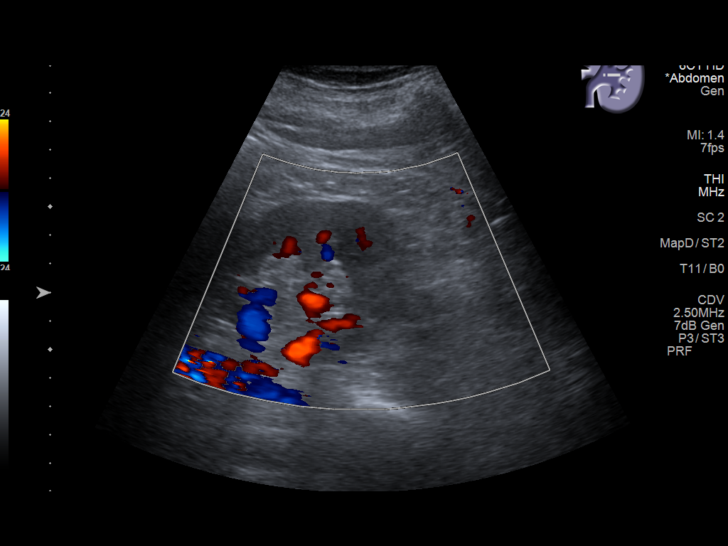
[im 76/83]
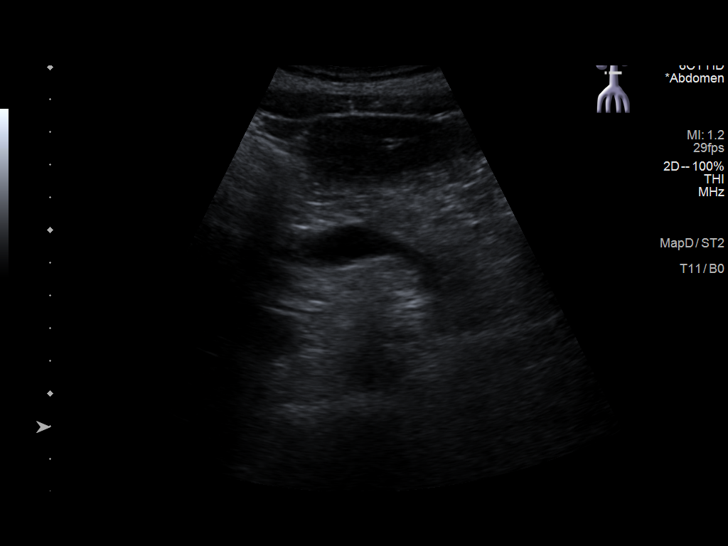
[im 83/83]
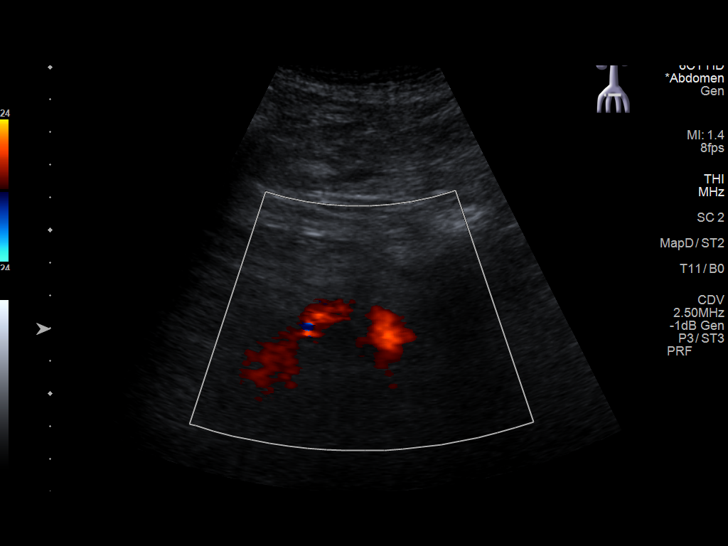

[13 of 25 positions shown; findings below may reference images not displayed]

FINDINGS: Suboptimal evaluation, with poor acoustic penetration secondary to
patient habitus.

Gallbladder: No gallstones or wall thickening visualized. No
sonographic Murphy sign noted by sonographer.

Common bile duct: Diameter: 0.2 cm

Liver: No focal lesion identified. Increased hepatic parenchymal
echogenicity. Portal vein is patent on color Doppler imaging with
normal direction of blood flow towards the liver.

IVC: No abnormality visualized.

Pancreas: Relatively obscured.  Visualized portion unremarkable.

Spleen: Size and appearance within normal limits.

Right Kidney: Length: 11.0 cm. Echogenicity within normal limits. No
mass or hydronephrosis visualized.

Left Kidney: Length: 9.8 cm. Echogenicity within normal limits. No
mass or hydronephrosis visualized.

Abdominal aorta: Imaged portions of the proximal, mid and distal
abdominal aorta, including the bifurcation, are nonaneurysmal.

Other findings: No ascites.
IMPRESSION: Suboptimal evaluation, within these constraints;

Echogenic liver.

Findings most commonly seen in hepatic steatosis, though may also
represent hepatitis and/or fibrosis.

## 2023-07-09 ENCOUNTER — Other Ambulatory Visit: Payer: Self-pay

## 2023-07-10 ENCOUNTER — Other Ambulatory Visit: Payer: Self-pay | Admitting: Family Medicine

## 2023-07-10 DIAGNOSIS — M79631 Pain in right forearm: Secondary | ICD-10-CM

## 2023-07-15 ENCOUNTER — Ambulatory Visit
Admission: RE | Admit: 2023-07-15 | Discharge: 2023-07-15 | Disposition: A | Discharge status: Home / Self Care | Payer: Managed Care, Other (non HMO) | Source: Ambulatory Visit | Attending: Family Medicine | Admitting: Family Medicine

## 2023-07-15 DIAGNOSIS — M79631 Pain in right forearm: Secondary | ICD-10-CM | POA: Insufficient documentation

## 2023-11-07 ENCOUNTER — Ambulatory Visit: Payer: Managed Care, Other (non HMO) | Admitting: Nurse Practitioner

## 2023-11-13 ENCOUNTER — Other Ambulatory Visit: Payer: Self-pay | Admitting: Family Medicine

## 2023-11-13 DIAGNOSIS — Z87891 Personal history of nicotine dependence: Secondary | ICD-10-CM

## 2023-11-13 LAB — COMPREHENSIVE METABOLIC PANEL WITH GFR
Albumin: 4.6 (ref 3.5–5.0)
Calcium: 6.8 — AB (ref 8.7–10.7)
eGFR: >90

## 2023-11-13 LAB — BASIC METABOLIC PANEL WITH GFR
BUN: 10 (ref 4–21)
CO2: 27 — AB (ref 13–22)
Chloride: 96 — AB (ref 99–108)
Creatinine: 0.8 (ref 0.5–1.1)
Glucose: 174
Potassium: 5 meq/L (ref 3.5–5.1)
Sodium: 132 — AB (ref 137–147)

## 2023-11-13 LAB — PROTEIN / CREATININE RATIO, URINE
Albumin, U: 13
Creatinine, Urine: 14

## 2023-11-13 LAB — CBC AND DIFFERENTIAL
HCT: 41 (ref 36–46)
Hemoglobin: 13.6 (ref 12.0–16.0)
Neutrophils Absolute: 3.5
Platelets: 373 K/uL (ref 150–400)
WBC: 5

## 2023-11-13 LAB — HEPATIC FUNCTION PANEL
ALT: 19 U/L (ref 7–35)
AST: 16 (ref 13–35)
Alkaline Phosphatase: 71 (ref 25–125)
Bilirubin, Total: 0.2

## 2023-11-13 LAB — IRON,TIBC AND FERRITIN PANEL
%SAT: 10
Ferritin: 15
Iron: 47
TIBC: 450

## 2023-11-13 LAB — LIPID PANEL
Cholesterol: 162 (ref 0–200)
HDL: 57 (ref 35–70)
LDL Cholesterol: 70
Triglycerides: 269 — AB (ref 40–160)

## 2023-11-13 LAB — CBC: RBC: 4.62 (ref 3.87–5.11)

## 2023-11-13 LAB — MICROALBUMIN / CREATININE URINE RATIO: Microalb Creat Ratio: 93

## 2023-11-13 LAB — HEMOGLOBIN A1C: Hemoglobin A1C: 7.1

## 2023-11-13 LAB — TSH: TSH: 1.64 (ref 0.41–5.90)

## 2023-11-21 ENCOUNTER — Other Ambulatory Visit: Payer: Self-pay | Admitting: Family Medicine

## 2023-11-21 DIAGNOSIS — Z1231 Encounter for screening mammogram for malignant neoplasm of breast: Secondary | ICD-10-CM

## 2023-11-26 ENCOUNTER — Ambulatory Visit: Payer: Managed Care, Other (non HMO)

## 2023-11-29 ENCOUNTER — Ambulatory Visit: Payer: Managed Care, Other (non HMO)

## 2023-12-11 ENCOUNTER — Ambulatory Visit
Admission: RE | Admit: 2023-12-11 | Discharge: 2023-12-11 | Disposition: A | Discharge status: Home / Self Care | Payer: Managed Care, Other (non HMO) | Source: Ambulatory Visit | Attending: Family Medicine | Admitting: Family Medicine

## 2023-12-11 DIAGNOSIS — Z1231 Encounter for screening mammogram for malignant neoplasm of breast: Secondary | ICD-10-CM | POA: Insufficient documentation

## 2024-08-03 ENCOUNTER — Ambulatory Visit: Admitting: Nurse Practitioner

## 2024-08-03 ENCOUNTER — Encounter: Payer: Self-pay | Admitting: Nurse Practitioner

## 2024-08-03 VITALS — BP 132/84 | HR 72 | Resp 16 | Ht 63.0 in | Wt 159.0 lb

## 2024-08-03 DIAGNOSIS — Z0000 Encounter for general adult medical examination without abnormal findings: Secondary | ICD-10-CM

## 2024-08-03 DIAGNOSIS — E039 Hypothyroidism, unspecified: Secondary | ICD-10-CM

## 2024-08-03 DIAGNOSIS — M545 Low back pain, unspecified: Secondary | ICD-10-CM | POA: Diagnosis not present

## 2024-08-03 DIAGNOSIS — E1141 Type 2 diabetes mellitus with diabetic mononeuropathy: Secondary | ICD-10-CM

## 2024-08-03 DIAGNOSIS — I158 Other secondary hypertension: Secondary | ICD-10-CM

## 2024-08-03 DIAGNOSIS — K219 Gastro-esophageal reflux disease without esophagitis: Secondary | ICD-10-CM

## 2024-08-03 DIAGNOSIS — G8929 Other chronic pain: Secondary | ICD-10-CM

## 2024-08-03 DIAGNOSIS — F909 Attention-deficit hyperactivity disorder, unspecified type: Secondary | ICD-10-CM

## 2024-08-03 DIAGNOSIS — D509 Iron deficiency anemia, unspecified: Secondary | ICD-10-CM

## 2024-08-03 DIAGNOSIS — J4520 Mild intermittent asthma, uncomplicated: Secondary | ICD-10-CM

## 2024-08-03 DIAGNOSIS — Z1211 Encounter for screening for malignant neoplasm of colon: Secondary | ICD-10-CM

## 2024-08-03 MED ORDER — ESOMEPRAZOLE MAGNESIUM 40 MG PO CPDR
40.0000 mg | DELAYED_RELEASE_CAPSULE | Freq: Every day | ORAL | 1 refills | Status: AC
Start: 2024-08-03 — End: ?

## 2024-08-03 MED ORDER — GABAPENTIN 300 MG PO CAPS: 300.0000 mg | ORAL_CAPSULE | Freq: Two times a day (BID) | ORAL | 1 refills | Status: AC

## 2024-08-03 MED ORDER — NAPROXEN 500 MG PO TABS: 500.0000 mg | ORAL_TABLET | Freq: Every day | ORAL | 1 refills | Status: AC

## 2024-08-03 MED ORDER — PROPRANOLOL HCL ER 60 MG PO CP24: 60.0000 mg | ORAL_CAPSULE | Freq: Every day | ORAL | 1 refills | Status: AC

## 2024-08-03 MED ORDER — EMPAGLIFLOZIN 25 MG PO TABS: 25.0000 mg | ORAL_TABLET | Freq: Every day | ORAL | 1 refills | Status: AC

## 2024-08-03 MED ORDER — FERROUS SULFATE 325 (65 FE) MG PO TABS: 325.0000 mg | ORAL_TABLET | Freq: Every day | ORAL | 1 refills | Status: AC

## 2024-08-03 MED ORDER — LEVOTHYROXINE SODIUM 100 MCG PO TABS: 100.0000 ug | ORAL_TABLET | Freq: Every day | ORAL | 1 refills | Status: AC

## 2024-08-03 MED ORDER — OZEMPIC (2 MG/DOSE) 8 MG/3ML ~~LOC~~ SOPN: 2.0000 mg | PEN_INJECTOR | SUBCUTANEOUS | 5 refills | Status: DC

## 2024-08-03 MED ORDER — GLYBURIDE-METFORMIN 5-500 MG PO TABS: 1.0000 | ORAL_TABLET | Freq: Every day | ORAL | 1 refills | Status: DC

## 2024-08-03 MED ORDER — PIOGLITAZONE HCL 45 MG PO TABS: 45.0000 mg | ORAL_TABLET | Freq: Every day | ORAL | 1 refills | Status: AC

## 2024-08-03 MED ORDER — LISINOPRIL 5 MG PO TABS: 5.0000 mg | ORAL_TABLET | Freq: Every day | ORAL | 1 refills | Status: AC

## 2024-08-03 NOTE — Progress Notes (Signed)
 BP 132/84   Pulse 72   Resp 16   Ht 5' 3 (1.6 m)   Wt 159 lb (72.1 kg)   LMP 07/31/2024   SpO2 99%   BMI 28.17 kg/m    Subjective:    Patient ID: Stephanie Travis, female    DOB: 1970/12/21, 53 y.o.   MRN: [id number]  HPI: Stephanie Travis is a 53 y.o. female  Chief Complaint  Patient presents with   Establish Care   Discussed the use of AI scribe software for clinical note transcription with the patient, who gave verbal consent to proceed.  History of Present Illness Stephanie Travis is a 53 year old female who presents to establish care.  Diabetes mellitus type 2 - Last hemoglobin A1c was 6.8% in April 2025 - Takes Jardiance 25 mg daily, glyburide -metformin  5-500 mg daily, and Actos  45 mg daily - Does not monitor blood glucose at home - No polyuria, polydipsia, or polyphagia - Currently attempting weight loss, but finds it challenging - Takes Ozempic 2 mg weekly  Hypothyroidism - Last TSH within normal range in January 2025 - Takes levothyroxine  100 mcg daily  Asthma - No current symptoms  Gastroesophageal reflux disease (gerd) - Takes Nexium  40 mg daily  Chronic back and neuropathic pain - Chronic back and nerve pain - Takes naproxen  500 mg at bedtime and gabapentin  300 mg at night due to drowsiness - Recent fall down stairs on Friday resulting in a bruise  Mental health disorders (depression, anxiety, adhd) - Managed with psychiatry - Takes Vraylar 4.5 mg daily, buspirone 15 mg twice daily, and Vyvanse 40 mg daily  Hypertension - Takes lisinopril  5 mg daily and propranolol  60 mg daily  Alcohol use disorder - History of alcohol abuse  Nutritional supplementation - Takes vitamin C and B complex over the counter         08/03/2024    8:52 AM 05/14/2017   11:13 AM 04/10/2017    9:12 AM  Depression screen PHQ 2/9  Decreased Interest 0 3 3  Down, Depressed, Hopeless 0 3 3  PHQ - 2 Score 0 6 6  Altered sleeping 0  3  Tired, decreased energy 3  3   Change in appetite 0  0  Feeling bad or failure about yourself  3  3  Trouble concentrating 3  3  Moving slowly or fidgety/restless 0  0  Suicidal thoughts 0  1  PHQ-9 Score 9  19    Relevant past medical, surgical, family and social history reviewed and updated as indicated. Interim medical history since our last visit reviewed. Allergies and medications reviewed and updated.  Review of Systems Constitutional: Negative for fever or weight change.  Respiratory: Negative for cough and shortness of breath.   Cardiovascular: Negative for chest pain or palpitations.  Gastrointestinal: Negative for abdominal pain, no bowel changes.  Musculoskeletal: Negative for gait problem or joint swelling.  Skin: Negative for rash.  Neurological: Negative for dizziness or headache.  No other specific complaints in a complete review of systems (except as listed in HPI above).      Objective:      BP 132/84   Pulse 72   Resp 16   Ht 5' 3 (1.6 m)   Wt 159 lb (72.1 kg)   LMP 07/31/2024   SpO2 99%   BMI 28.17 kg/m    Wt Readings from Last 3 Encounters:  08/03/24 159 lb (72.1 kg)  11/27/22 159 lb (72.1 kg)  08/29/18 169 lb (76.7 kg)    Physical Exam VITALS: BP- 132/84 GENERAL: Alert, cooperative, well developed, no acute distress. HEENT: Normocephalic, normal oropharynx, moist mucous membranes. CHEST: Clear to auscultation bilaterally, no wheezes, rhonchi, or crackles. CARDIOVASCULAR: Normal heart rate and rhythm, S1 and S2 normal without murmurs. ABDOMEN: Soft, non-tender, non-distended, without organomegaly, normal bowel sounds. EXTREMITIES: No cyanosis or edema. MUSCULOSKELETAL: Normal grip strength. NEUROLOGICAL: Cranial nerves grossly intact, moves all extremities without gross motor or sensory deficit.  Results for orders placed or performed in visit on 11/27/22  Pathology ([facility])   Collection Time: 11/27/22  4:42 PM  Result Value Ref Range   Diagnosis synopsis: Comment     Specimen: Comment    Diagnosis: Comment    Gross description: Comment    Electronically signed by: Comment    Disclaimer(s): Comment    CPT code(s): Comment    CPT Disclaimer: Comment    Clinician provided ICD: Comment    Pathologist provided ICD: Comment           Assessment & Plan:   Problem List Items Addressed This Visit       Respiratory   Asthma     Endocrine   Type 2 diabetes mellitus (HCC)   Relevant Medications   pioglitazone  (ACTOS ) 45 MG tablet   OZEMPIC, 2 MG/DOSE, 8 MG/3ML SOPN   lisinopril  (ZESTRIL ) 5 MG tablet   glyBURIDE -metformin  (GLUCOVANCE) 5-500 MG tablet   gabapentin  (NEURONTIN ) 300 MG capsule   empagliflozin (JARDIANCE) 25 MG TABS tablet   Other Relevant Orders   HgB A1c   Comprehensive Metabolic Panel (CMET)   Urine Microalbumin w/creat. ratio   Hypothyroid   Relevant Medications   propranolol  ER (INDERAL  LA) 60 MG 24 hr capsule   levothyroxine  (SYNTHROID ) 100 MCG tablet   Other Relevant Orders   TSH     Other   Attention deficit hyperactivity disorder (ADHD)   Relevant Medications   propranolol  ER (INDERAL  LA) 60 MG 24 hr capsule   Other Visit Diagnoses       Encounter for medical examination to establish care    -  Primary   Relevant Orders   HgB A1c   Comprehensive Metabolic Panel (CMET)   CBC with Differential/Platelet   Lipid Profile   Urine Microalbumin w/creat. ratio   TSH     Colon cancer screening       Relevant Orders   Ambulatory referral to Gastroenterology     Benign essential tremor       Relevant Medications   propranolol  ER (INDERAL  LA) 60 MG 24 hr capsule     Chronic bilateral low back pain without sciatica       Relevant Medications   naproxen  (NAPROSYN ) 500 MG tablet   gabapentin  (NEURONTIN ) 300 MG capsule     Gastroesophageal reflux disease without esophagitis       Relevant Medications   esomeprazole  (NEXIUM ) 40 MG capsule     Iron deficiency anemia, unspecified iron deficiency anemia type        Relevant Medications   ferrous sulfate 325 (65 FE) MG tablet   Other Relevant Orders   Iron, TIBC and Ferritin Panel        Assessment and Plan Assessment & Plan Adult Wellness Visit Routine adult wellness visit to establish care. Blood pressure is 132/84 mmHg.  - Refill all current medications as requested - Perform lab work including A1c  Type 2 diabetes mellitus with neuropathy Type 2 diabetes mellitus with last A1c of  6.8% in April 2025. Managed with Jardiance, glyburide -metformin , and Ozempic. No current monitoring of blood glucose. Denies polyuria, polydipsia, or polyphagia. - Perform A1c test to assess current glycemic control -on gabapentin    Hypothyroidism Hypothyroidism managed with levothyroxine  100 mcg daily. Last TSH was within normal range in January 2025. No reported issues with current dose.  Chronic back pain and nerve pain Chronic back pain and nerve pain managed with naproxen  and gabapentin . Gabapentin  taken at night due to sedative effects. No new concerns reported. - Continue current pain management regimen  Asthma Asthma with no current issues reported.  GERD -continue nexium  40 mg daily  Iron deficiency anemia -taking iron supplement,  will recheck level        Follow up plan: Return in about 4 months (around 12/04/2024) for follow up.

## 2024-08-04 ENCOUNTER — Ambulatory Visit: Payer: Self-pay | Admitting: Nurse Practitioner

## 2024-08-04 DIAGNOSIS — Z1211 Encounter for screening for malignant neoplasm of colon: Secondary | ICD-10-CM

## 2024-08-04 LAB — COMPREHENSIVE METABOLIC PANEL WITH GFR
AG Ratio: 1.9 (calc) (ref 1.0–2.5)
ALT: 20 U/L (ref 6–29)
AST: 16 U/L (ref 10–35)
Albumin: 4.2 g/dL (ref 3.6–5.1)
Alkaline phosphatase (APISO): 59 U/L (ref 37–153)
BUN: 15 mg/dL (ref 7–25)
CO2: 24 mmol/L (ref 20–32)
Calcium: 9.4 mg/dL (ref 8.6–10.4)
Chloride: 95 mmol/L — ABNORMAL LOW (ref 98–110)
Creat: 0.88 mg/dL (ref 0.50–1.03)
Globulin: 2.2 g/dL (ref 1.9–3.7)
Glucose, Bld: 236 mg/dL — ABNORMAL HIGH (ref 65–99)
Potassium: 4.8 mmol/L (ref 3.5–5.3)
Sodium: 130 mmol/L — ABNORMAL LOW (ref 135–146)
Total Bilirubin: 0.2 mg/dL (ref 0.2–1.2)
Total Protein: 6.4 g/dL (ref 6.1–8.1)
eGFR: 80 mL/min/1.73m2 (ref >=60)

## 2024-08-04 LAB — CBC WITH DIFFERENTIAL/PLATELET
Absolute Lymphocytes: 1154 {cells}/uL (ref 850–3900)
Absolute Monocytes: 400 {cells}/uL (ref 200–950)
Basophils Absolute: 41 {cells}/uL (ref 0–200)
Basophils Relative: 0.7 %
Eosinophils Absolute: 168 {cells}/uL (ref 15–500)
Eosinophils Relative: 2.9 %
HCT: 40.7 % (ref 35.0–45.0)
Hemoglobin: 13.6 g/dL (ref 11.7–15.5)
MCH: 30.6 pg (ref 27.0–33.0)
MCHC: 33.4 g/dL (ref 32.0–36.0)
MCV: 91.7 fL (ref 80.0–100.0)
MPV: 9 fL (ref 7.5–12.5)
Monocytes Relative: 6.9 %
Neutro Abs: 4037 {cells}/uL (ref 1500–7800)
Neutrophils Relative %: 69.6 %
Platelets: 348 Thousand/uL (ref 140–400)
RBC: 4.44 Million/uL (ref 3.80–5.10)
RDW: 12.4 % (ref 11.0–15.0)
Total Lymphocyte: 19.9 %
WBC: 5.8 Thousand/uL (ref 3.8–10.8)

## 2024-08-04 LAB — MICROALBUMIN / CREATININE URINE RATIO
Creatinine, Urine: 13 mg/dL — ABNORMAL LOW (ref 20–275)
Microalb Creat Ratio: 31 mg/g{creat} — ABNORMAL HIGH (ref <30)
Microalb, Ur: 0.4 mg/dL

## 2024-08-04 LAB — IRON,TIBC AND FERRITIN PANEL
%SAT: 14 % — ABNORMAL LOW (ref 16–45)
Ferritin: 12 ng/mL — ABNORMAL LOW (ref 16–232)
Iron: 50 ug/dL (ref 45–160)
TIBC: 353 ug/dL (ref 250–450)

## 2024-08-04 LAB — HEMOGLOBIN A1C
Hgb A1c MFr Bld: 7.1 % — ABNORMAL HIGH (ref <5.7)
Mean Plasma Glucose: 157 mg/dL
eAG (mmol/L): 8.7 mmol/L

## 2024-08-04 LAB — LIPID PANEL
Cholesterol: 151 mg/dL (ref <200)
HDL: 63 mg/dL (ref >=50)
LDL Cholesterol (Calc): 65 mg/dL
Non-HDL Cholesterol (Calc): 88 mg/dL (ref <130)
Total CHOL/HDL Ratio: 2.4 (calc) (ref <5.0)
Triglycerides: 145 mg/dL (ref <150)

## 2024-08-04 LAB — TSH: TSH: 1.78 m[IU]/L

## 2024-08-17 LAB — COLOGUARD: COLOGUARD: NEGATIVE

## 2024-10-13 ENCOUNTER — Encounter: Payer: Self-pay | Admitting: Nurse Practitioner

## 2024-10-21 ENCOUNTER — Encounter: Payer: Self-pay | Admitting: Nurse Practitioner

## 2024-10-23 ENCOUNTER — Other Ambulatory Visit: Payer: Self-pay | Admitting: Nurse Practitioner

## 2024-10-23 DIAGNOSIS — E1141 Type 2 diabetes mellitus with diabetic mononeuropathy: Secondary | ICD-10-CM

## 2024-10-23 MED ORDER — GLYBURIDE-METFORMIN 5-500 MG PO TABS: 2.0000 | ORAL_TABLET | Freq: Two times a day (BID) | ORAL | 1 refills | Status: AC

## 2024-11-17 ENCOUNTER — Other Ambulatory Visit: Payer: Self-pay | Admitting: Nurse Practitioner

## 2024-11-17 DIAGNOSIS — Z1231 Encounter for screening mammogram for malignant neoplasm of breast: Secondary | ICD-10-CM

## 2024-11-19 ENCOUNTER — Encounter: Payer: Self-pay | Admitting: Nurse Practitioner

## 2024-12-04 ENCOUNTER — Encounter: Payer: Self-pay | Admitting: Nurse Practitioner

## 2024-12-04 ENCOUNTER — Ambulatory Visit: Admitting: Nurse Practitioner

## 2024-12-04 VITALS — BP 118/80 | HR 90 | Temp 98.3°F | Ht 63.0 in | Wt 157.0 lb

## 2024-12-04 DIAGNOSIS — I158 Other secondary hypertension: Secondary | ICD-10-CM

## 2024-12-04 DIAGNOSIS — J4520 Mild intermittent asthma, uncomplicated: Secondary | ICD-10-CM

## 2024-12-04 DIAGNOSIS — E039 Hypothyroidism, unspecified: Secondary | ICD-10-CM

## 2024-12-04 DIAGNOSIS — G8929 Other chronic pain: Secondary | ICD-10-CM

## 2024-12-04 DIAGNOSIS — E1141 Type 2 diabetes mellitus with diabetic mononeuropathy: Secondary | ICD-10-CM

## 2024-12-04 DIAGNOSIS — K219 Gastro-esophageal reflux disease without esophagitis: Secondary | ICD-10-CM

## 2024-12-04 LAB — POCT GLYCOSYLATED HEMOGLOBIN (HGB A1C): Hemoglobin A1C: 6.8 % — AB (ref 4.0–5.6)

## 2024-12-04 MED ORDER — RYBELSUS 14 MG PO TABS: 14.0000 mg | ORAL_TABLET | Freq: Every day | ORAL | 1 refills | Status: AC

## 2024-12-04 NOTE — Progress Notes (Signed)
 "  BP 118/80   Pulse 90   Temp 98.3 F (36.8 C)   Ht 5' 3 (1.6 m)   Wt 157 lb (71.2 kg)   SpO2 99%   BMI 27.81 kg/m    Subjective:    Patient ID: Stephanie Travis, female    DOB: 10-05-1971, 54 y.o.   MRN: [id number]  HPI: Stephanie Travis is a 54 y.o. female presents for follow up appointment.   DM: Would like to switch from ozempic  to wegovy tablets. Patient understands the switch will be from ozempic  to Rybelsus  tablets. Due for dm foot exam, eye exam Lab Results  Component Value Date   HGBA1C 6.8 (A) 12/04/2024    Neuropathy/chronic back pain: takes gabapentin  BID  HTN: Blood pressure is at goal today.  Continue taking lisinopril  5 mg daily BP Readings from Last 3 Encounters:  12/04/24 118/80  08/03/24 132/84  11/27/22 120/80    Asthma: stable, no meds  Hypothyroidism: denies any symptoms, continue levothyroxine  100 mcg daily. Lab Results  Component Value Date   TSH 1.78 08/03/2024         08/03/2024    8:52 AM 05/14/2017   11:13 AM 04/10/2017    9:12 AM  Depression screen PHQ 2/9  Decreased Interest 0 3 3  Down, Depressed, Hopeless 0 3 3  PHQ - 2 Score 0 6 6  Altered sleeping 0  3  Tired, decreased energy 3  3  Change in appetite 0  0  Feeling bad or failure about yourself  3  3  Trouble concentrating 3  3  Moving slowly or fidgety/restless 0  0  Suicidal thoughts 0  1  PHQ-9 Score 9   19      Data saved with a previous flowsheet row definition    Relevant past medical, surgical, family and social history reviewed and updated as indicated. Interim medical history since our last visit reviewed. Allergies and medications reviewed and updated.  Review of Systems  All other systems reviewed and are negative.   Constitutional: Negative for fever or weight change.  Respiratory: Negative for cough and shortness of breath.   Cardiovascular: Negative for chest pain or palpitations.  Gastrointestinal: Negative for abdominal pain, no bowel changes.   Musculoskeletal: Negative for gait problem or joint swelling.  Skin: Negative for rash.  Neurological: Negative for dizziness or headache.  No other specific complaints in a complete review of systems (except as listed in HPI above).      Objective:     BP 118/80   Pulse 90   Temp 98.3 F (36.8 C)   Ht 5' 3 (1.6 m)   Wt 157 lb (71.2 kg)   SpO2 99%   BMI 27.81 kg/m    Wt Readings from Last 3 Encounters:  12/04/24 157 lb (71.2 kg)  08/03/24 159 lb (72.1 kg)  11/27/22 159 lb (72.1 kg)    Physical Exam Vitals reviewed.  Constitutional:      Appearance: Normal appearance.  Cardiovascular:     Rate and Rhythm: Normal rate and regular rhythm.     Heart sounds: Normal heart sounds.  Pulmonary:     Effort: Pulmonary effort is normal.     Breath sounds: Normal breath sounds.  Neurological:     Mental Status: She is alert.     Results for orders placed or performed in visit on 12/04/24  POCT HgB A1C   Collection Time: 12/04/24 10:40 AM  Result Value Ref Range   Hemoglobin  A1C 6.8 (A) 4.0 - 5.6 %   HbA1c POC (<> result, manual entry)     HbA1c, POC (prediabetic range)     HbA1c, POC (controlled diabetic range)            Assessment & Plan:   Problem List Items Addressed This Visit       Cardiovascular and Mediastinum   Other secondary hypertension   Blood pressure is at goal today.  Continue taking lisinopril  5 mg daily        Respiratory   Asthma   stable        Digestive   Gastroesophageal reflux disease without esophagitis   Stable, avoids triggers        Endocrine   Type 2 diabetes mellitus (HCC) - Primary    Would like to switch from ozempic  to wegovy tablets. Patient understands the switch will be from ozempic  to Rybelsus  tablets.  A1c down to 5.9 today      Relevant Medications   Semaglutide  (RYBELSUS ) 14 MG TABS   Other Relevant Orders   POCT HgB A1C (Completed)   HM Diabetes Foot Exam (Completed)   Hypothyroid   denies any  symptoms, continue levothyroxine  100 mcg daily.        Other   Chronic bilateral low back pain without sciatica   Stable with gabapentin  300 mg two times a day            Follow up plan: Return in about 4 months (around 04/03/2025) for follow up.    I have reviewed this encounter including the documentation in this note and/or discussed this patient with the provider, Diamone Mays SNP, I am certifying that I agree with the content of this note as supervising/preceptor nurse practitioner.  Mliss Spray, FNP-C [facility] 12/04/2024, 11:38 AM     "

## 2024-12-04 NOTE — Assessment & Plan Note (Signed)
 Stable, avoids triggers.

## 2024-12-04 NOTE — Assessment & Plan Note (Signed)
 stable  stable

## 2024-12-04 NOTE — Assessment & Plan Note (Signed)
 denies any symptoms, continue levothyroxine  100 mcg daily.

## 2024-12-04 NOTE — Assessment & Plan Note (Signed)
 Stable with gabapentin  300 mg two times a day

## 2024-12-04 NOTE — Assessment & Plan Note (Signed)
"   Would like to switch from ozempic  to wegovy tablets. Patient understands the switch will be from ozempic  to Rybelsus  tablets.  A1c down to 5.9 today "

## 2024-12-04 NOTE — Assessment & Plan Note (Signed)
 Blood pressure is at goal today.  Continue taking lisinopril  5 mg daily

## 2024-12-16 ENCOUNTER — Encounter

## 2025-06-03 ENCOUNTER — Ambulatory Visit: Admitting: Nurse Practitioner
